# Patient Record
Sex: Female | Born: 1945
Health system: Southern US, Community
[De-identification: ages and names within clinical notes are randomized; demographics above are authoritative.]

## PROBLEM LIST (undated history)

## (undated) DIAGNOSIS — I10 Essential (primary) hypertension: Secondary | ICD-10-CM

## (undated) DIAGNOSIS — T7840XA Allergy, unspecified, initial encounter: Secondary | ICD-10-CM

## (undated) DIAGNOSIS — G43909 Migraine, unspecified, not intractable, without status migrainosus: Secondary | ICD-10-CM

## (undated) DIAGNOSIS — K635 Polyp of colon: Secondary | ICD-10-CM

## (undated) DIAGNOSIS — F419 Anxiety disorder, unspecified: Secondary | ICD-10-CM

## (undated) DIAGNOSIS — E785 Hyperlipidemia, unspecified: Secondary | ICD-10-CM

## (undated) DIAGNOSIS — M1990 Unspecified osteoarthritis, unspecified site: Secondary | ICD-10-CM

## (undated) DIAGNOSIS — E039 Hypothyroidism, unspecified: Secondary | ICD-10-CM

## (undated) DIAGNOSIS — H269 Unspecified cataract: Secondary | ICD-10-CM

## (undated) DIAGNOSIS — H43399 Other vitreous opacities, unspecified eye: Secondary | ICD-10-CM

## (undated) DIAGNOSIS — E119 Type 2 diabetes mellitus without complications: Secondary | ICD-10-CM

## (undated) DIAGNOSIS — K8020 Calculus of gallbladder without cholecystitis without obstruction: Secondary | ICD-10-CM

## (undated) HISTORY — DX: Unspecified osteoarthritis, unspecified site: M19.90

## (undated) HISTORY — DX: Calculus of gallbladder without cholecystitis without obstruction: K80.20

## (undated) HISTORY — DX: Migraine, unspecified, not intractable, without status migrainosus: G43.909

## (undated) HISTORY — PX: SIGMOIDOSCOPY: SUR1295

## (undated) HISTORY — DX: Type 2 diabetes mellitus without complications: E11.9

## (undated) HISTORY — DX: Hyperlipidemia, unspecified: E78.5

## (undated) HISTORY — PX: TONSILLECTOMY: SUR1361

## (undated) HISTORY — DX: Essential (primary) hypertension: I10

## (undated) HISTORY — DX: Other vitreous opacities, unspecified eye: H43.399

## (undated) HISTORY — DX: Hypothyroidism, unspecified: E03.9

## (undated) HISTORY — DX: Unspecified cataract: H26.9

## (undated) HISTORY — PX: TUBAL LIGATION: SHX77

## (undated) HISTORY — DX: Anxiety disorder, unspecified: F41.9

## (undated) HISTORY — DX: Allergy, unspecified, initial encounter: T78.40XA

## (undated) HISTORY — DX: Polyp of colon: K63.5

## (undated) HISTORY — PX: OTHER SURGICAL HISTORY: SHX169

---

## 1998-01-11 ENCOUNTER — Ambulatory Visit (HOSPITAL_COMMUNITY): Admission: RE | Admit: 1998-01-11 | Discharge: 1998-01-11 | Payer: Self-pay | Admitting: *Deleted

## 1999-04-18 ENCOUNTER — Encounter: Admission: RE | Admit: 1999-04-18 | Discharge: 1999-04-18 | Payer: Self-pay | Admitting: Family Medicine

## 1999-04-18 ENCOUNTER — Encounter: Payer: Self-pay | Admitting: Family Medicine

## 1999-04-22 ENCOUNTER — Other Ambulatory Visit: Admission: RE | Admit: 1999-04-22 | Discharge: 1999-04-22 | Payer: Self-pay | Admitting: Family Medicine

## 1999-08-01 ENCOUNTER — Encounter: Payer: Self-pay | Admitting: Family Medicine

## 1999-08-01 ENCOUNTER — Encounter: Admission: RE | Admit: 1999-08-01 | Discharge: 1999-08-01 | Payer: Self-pay | Admitting: Family Medicine

## 2000-04-18 ENCOUNTER — Encounter: Payer: Self-pay | Admitting: Family Medicine

## 2000-04-18 ENCOUNTER — Encounter: Admission: RE | Admit: 2000-04-18 | Discharge: 2000-04-18 | Payer: Self-pay | Admitting: Family Medicine

## 2000-04-27 ENCOUNTER — Other Ambulatory Visit: Admission: RE | Admit: 2000-04-27 | Discharge: 2000-04-27 | Payer: Self-pay | Admitting: Family Medicine

## 2000-05-23 ENCOUNTER — Encounter: Payer: Self-pay | Admitting: Occupational Medicine

## 2000-05-23 ENCOUNTER — Encounter: Admission: RE | Admit: 2000-05-23 | Discharge: 2000-05-23 | Payer: Self-pay | Admitting: Occupational Medicine

## 2001-04-22 ENCOUNTER — Encounter: Admission: RE | Admit: 2001-04-22 | Discharge: 2001-04-22 | Payer: Self-pay | Admitting: Family Medicine

## 2001-04-22 ENCOUNTER — Encounter: Payer: Self-pay | Admitting: Family Medicine

## 2001-05-17 ENCOUNTER — Other Ambulatory Visit: Admission: RE | Admit: 2001-05-17 | Discharge: 2001-05-17 | Payer: Self-pay | Admitting: Family Medicine

## 2002-04-23 ENCOUNTER — Encounter: Payer: Self-pay | Admitting: Family Medicine

## 2002-04-23 ENCOUNTER — Encounter: Admission: RE | Admit: 2002-04-23 | Discharge: 2002-04-23 | Payer: Self-pay | Admitting: Family Medicine

## 2002-05-19 ENCOUNTER — Other Ambulatory Visit: Admission: RE | Admit: 2002-05-19 | Discharge: 2002-05-19 | Payer: Self-pay | Admitting: Family Medicine

## 2003-05-06 ENCOUNTER — Encounter: Admission: RE | Admit: 2003-05-06 | Discharge: 2003-05-06 | Payer: Self-pay | Admitting: Family Medicine

## 2003-05-19 ENCOUNTER — Other Ambulatory Visit: Admission: RE | Admit: 2003-05-19 | Discharge: 2003-05-19 | Payer: Self-pay | Admitting: Family Medicine

## 2003-06-02 ENCOUNTER — Encounter: Admission: RE | Admit: 2003-06-02 | Discharge: 2003-06-02 | Payer: Self-pay | Admitting: Family Medicine

## 2004-05-19 ENCOUNTER — Ambulatory Visit: Payer: Self-pay | Admitting: Family Medicine

## 2004-05-19 ENCOUNTER — Other Ambulatory Visit: Admission: RE | Admit: 2004-05-19 | Discharge: 2004-05-19 | Payer: Self-pay | Admitting: Family Medicine

## 2004-06-08 ENCOUNTER — Encounter: Admission: RE | Admit: 2004-06-08 | Discharge: 2004-06-08 | Payer: Self-pay | Admitting: Family Medicine

## 2004-08-26 ENCOUNTER — Ambulatory Visit: Payer: Self-pay | Admitting: Family Medicine

## 2004-10-10 ENCOUNTER — Ambulatory Visit: Payer: Self-pay | Admitting: Family Medicine

## 2005-05-23 ENCOUNTER — Ambulatory Visit: Payer: Self-pay | Admitting: Family Medicine

## 2005-05-25 ENCOUNTER — Ambulatory Visit: Payer: Self-pay | Admitting: Family Medicine

## 2005-06-05 ENCOUNTER — Other Ambulatory Visit: Admission: RE | Admit: 2005-06-05 | Discharge: 2005-06-05 | Payer: Self-pay | Admitting: Family Medicine

## 2005-06-05 ENCOUNTER — Ambulatory Visit: Payer: Self-pay | Admitting: Family Medicine

## 2005-06-06 ENCOUNTER — Encounter: Payer: Self-pay | Admitting: Family Medicine

## 2005-09-05 ENCOUNTER — Ambulatory Visit: Payer: Self-pay | Admitting: Family Medicine

## 2005-09-13 ENCOUNTER — Ambulatory Visit: Payer: Self-pay | Admitting: Family Medicine

## 2006-04-26 ENCOUNTER — Encounter: Admission: RE | Admit: 2006-04-26 | Discharge: 2006-04-26 | Payer: Self-pay | Admitting: Family Medicine

## 2006-05-23 ENCOUNTER — Ambulatory Visit: Payer: Self-pay | Admitting: Family Medicine

## 2006-05-23 LAB — CONVERTED CEMR LAB
AST: 17 units/L (ref 0–37)
Albumin: 3.7 g/dL (ref 3.5–5.2)
BUN: 11 mg/dL (ref 6–23)
Basophils Relative: 0.8 % (ref 0.0–1.0)
Chol/HDL Ratio, serum: 4.4
Cholesterol: 197 mg/dL (ref 0–200)
Creatinine, Ser: 0.9 mg/dL (ref 0.4–1.2)
Eosinophil percent: 4.8 % (ref 0.0–5.0)
HCT: 40.2 % (ref 36.0–46.0)
HDL: 44.7 mg/dL (ref >39.0)
Hemoglobin: 12.9 g/dL (ref 12.0–15.0)
MCHC: 32.2 g/dL (ref 30.0–36.0)
Neutrophils Relative %: 37.7 % — ABNORMAL LOW (ref 43.0–77.0)
Potassium: 4.1 meq/L (ref 3.5–5.1)
RDW: 13.8 % (ref 11.5–14.6)
Total Bilirubin: 0.7 mg/dL (ref 0.3–1.2)
Total Protein: 7.7 g/dL (ref 6.0–8.3)
WBC: 6.7 10*3/uL (ref 4.5–10.5)

## 2006-06-06 ENCOUNTER — Other Ambulatory Visit: Admission: RE | Admit: 2006-06-06 | Discharge: 2006-06-06 | Payer: Self-pay | Admitting: Family Medicine

## 2006-06-06 ENCOUNTER — Ambulatory Visit: Payer: Self-pay | Admitting: Family Medicine

## 2006-06-06 ENCOUNTER — Encounter: Payer: Self-pay | Admitting: Family Medicine

## 2006-07-06 ENCOUNTER — Ambulatory Visit: Payer: Self-pay | Admitting: Gastroenterology

## 2006-09-10 ENCOUNTER — Ambulatory Visit: Payer: Self-pay | Admitting: Family Medicine

## 2006-10-22 ENCOUNTER — Ambulatory Visit: Payer: Self-pay | Admitting: Family Medicine

## 2006-10-23 LAB — CONVERTED CEMR LAB
AST: 19 units/L (ref 0–37)
Amylase: 79 units/L (ref 27–131)
Bilirubin, Direct: 0.1 mg/dL (ref 0.0–0.3)
CO2: 33 meq/L — ABNORMAL HIGH (ref 19–32)
Chloride: 104 meq/L (ref 96–112)
Eosinophils Absolute: 0.3 10*3/uL (ref 0.0–0.6)
Eosinophils Relative: 3.5 % (ref 0.0–5.0)
GFR calc Af Amer: 111 mL/min
GFR calc non Af Amer: 91 mL/min
Glucose, Bld: 81 mg/dL (ref 70–99)
HCT: 38.9 % (ref 36.0–46.0)
Hemoglobin: 13.3 g/dL (ref 12.0–15.0)
Lymphocytes Relative: 42.4 % (ref 12.0–46.0)
MCV: 83.7 fL (ref 78.0–100.0)
Neutro Abs: 3.3 10*3/uL (ref 1.4–7.7)
Neutrophils Relative %: 44.1 % (ref 43.0–77.0)
Sodium: 140 meq/L (ref 135–145)
Total Protein: 8 g/dL (ref 6.0–8.3)
WBC: 7.7 10*3/uL (ref 4.5–10.5)

## 2006-10-28 ENCOUNTER — Encounter: Admission: RE | Admit: 2006-10-28 | Discharge: 2006-10-28 | Payer: Self-pay | Admitting: Family Medicine

## 2007-02-26 DIAGNOSIS — F411 Generalized anxiety disorder: Secondary | ICD-10-CM | POA: Insufficient documentation

## 2007-02-26 DIAGNOSIS — E782 Mixed hyperlipidemia: Secondary | ICD-10-CM | POA: Insufficient documentation

## 2007-02-26 DIAGNOSIS — I10 Essential (primary) hypertension: Secondary | ICD-10-CM | POA: Insufficient documentation

## 2007-02-26 DIAGNOSIS — E039 Hypothyroidism, unspecified: Secondary | ICD-10-CM | POA: Insufficient documentation

## 2007-05-17 ENCOUNTER — Encounter: Payer: Self-pay | Admitting: Family Medicine

## 2007-05-17 ENCOUNTER — Encounter: Admission: RE | Admit: 2007-05-17 | Discharge: 2007-05-17 | Payer: Self-pay | Admitting: Family Medicine

## 2007-06-12 ENCOUNTER — Ambulatory Visit: Payer: Self-pay | Admitting: Family Medicine

## 2007-06-12 LAB — CONVERTED CEMR LAB
Glucose, Urine, Semiquant: NEGATIVE
Nitrite: NEGATIVE
Protein, U semiquant: NEGATIVE
Specific Gravity, Urine: 1.015

## 2007-06-14 LAB — CONVERTED CEMR LAB
ALT: 19 units/L (ref 0–35)
AST: 25 units/L (ref 0–37)
Basophils Relative: 0.2 % (ref 0.0–1.0)
Bilirubin, Direct: 0.1 mg/dL (ref 0.0–0.3)
CO2: 30 meq/L (ref 19–32)
Calcium: 9.3 mg/dL (ref 8.4–10.5)
Chloride: 103 meq/L (ref 96–112)
Creatinine, Ser: 0.9 mg/dL (ref 0.4–1.2)
Eosinophils Relative: 4.1 % (ref 0.0–5.0)
GFR calc non Af Amer: 68 mL/min
Glucose, Bld: 107 mg/dL — ABNORMAL HIGH (ref 70–99)
HCT: 37.8 % (ref 36.0–46.0)
LDL Cholesterol: 81 mg/dL (ref 0–99)
MCV: 82.5 fL (ref 78.0–100.0)
Neutrophils Relative %: 49.4 % (ref 43.0–77.0)
Platelets: 257 10*3/uL (ref 150–400)
RBC: 4.58 M/uL (ref 3.87–5.11)
RDW: 12.8 % (ref 11.5–14.6)
Sodium: 141 meq/L (ref 135–145)
TSH: 2.74 microintl units/mL (ref 0.35–5.50)
Total Bilirubin: 0.7 mg/dL (ref 0.3–1.2)
Total CHOL/HDL Ratio: 3.4
Triglycerides: 123 mg/dL (ref 0–149)
VLDL: 25 mg/dL (ref 0–40)
WBC: 6.8 10*3/uL (ref 4.5–10.5)

## 2007-06-19 ENCOUNTER — Ambulatory Visit: Payer: Self-pay | Admitting: Family Medicine

## 2007-06-19 ENCOUNTER — Encounter: Payer: Self-pay | Admitting: Family Medicine

## 2007-06-19 ENCOUNTER — Other Ambulatory Visit: Admission: RE | Admit: 2007-06-19 | Discharge: 2007-06-19 | Payer: Self-pay | Admitting: Family Medicine

## 2007-06-25 ENCOUNTER — Encounter: Payer: Self-pay | Admitting: Family Medicine

## 2008-05-18 ENCOUNTER — Encounter: Admission: RE | Admit: 2008-05-18 | Discharge: 2008-05-18 | Payer: Self-pay | Admitting: Family Medicine

## 2008-05-18 ENCOUNTER — Ambulatory Visit: Payer: Self-pay | Admitting: Family Medicine

## 2008-06-16 ENCOUNTER — Ambulatory Visit: Payer: Self-pay | Admitting: Family Medicine

## 2008-06-16 LAB — CONVERTED CEMR LAB
Ketones, urine, test strip: NEGATIVE
Nitrite: NEGATIVE
Specific Gravity, Urine: 1.02
Urobilinogen, UA: 0.2
WBC Urine, dipstick: NEGATIVE

## 2008-06-19 LAB — CONVERTED CEMR LAB
ALT: 19 units/L (ref 0–35)
AST: 24 units/L (ref 0–37)
Alkaline Phosphatase: 66 units/L (ref 39–117)
BUN: 10 mg/dL (ref 6–23)
Bilirubin, Direct: 0.1 mg/dL (ref 0.0–0.3)
CO2: 30 meq/L (ref 19–32)
Chloride: 103 meq/L (ref 96–112)
Eosinophils Relative: 4.5 % (ref 0.0–5.0)
Glucose, Bld: 113 mg/dL — ABNORMAL HIGH (ref 70–99)
HDL: 34.7 mg/dL — ABNORMAL LOW (ref >39.0)
LDL Cholesterol: 76 mg/dL (ref 0–99)
Lymphocytes Relative: 37.2 % (ref 12.0–46.0)
Monocytes Relative: 7.4 % (ref 3.0–12.0)
Platelets: 228 10*3/uL (ref 150–400)
Potassium: 3.9 meq/L (ref 3.5–5.1)
RDW: 13.3 % (ref 11.5–14.6)
Sodium: 142 meq/L (ref 135–145)
Total CHOL/HDL Ratio: 4.1
Total Protein: 8.1 g/dL (ref 6.0–8.3)
Triglycerides: 169 mg/dL — ABNORMAL HIGH (ref 0–149)
WBC: 7.4 10*3/uL (ref 4.5–10.5)

## 2008-06-22 ENCOUNTER — Ambulatory Visit: Payer: Self-pay | Admitting: Family Medicine

## 2008-08-13 ENCOUNTER — Telehealth: Payer: Self-pay | Admitting: Family Medicine

## 2009-05-20 ENCOUNTER — Encounter: Admission: RE | Admit: 2009-05-20 | Discharge: 2009-05-20 | Payer: Self-pay | Admitting: Family Medicine

## 2009-06-22 ENCOUNTER — Ambulatory Visit: Payer: Self-pay | Admitting: Family Medicine

## 2009-06-22 LAB — CONVERTED CEMR LAB
Bilirubin Urine: NEGATIVE
Nitrite: NEGATIVE
Protein, U semiquant: NEGATIVE
Urobilinogen, UA: 0.2

## 2009-06-24 LAB — CONVERTED CEMR LAB
ALT: 17 units/L (ref 0–35)
AST: 21 units/L (ref 0–37)
BUN: 9 mg/dL (ref 6–23)
Bilirubin, Direct: 0.1 mg/dL (ref 0.0–0.3)
Cholesterol: 141 mg/dL (ref 0–200)
Eosinophils Relative: 3.4 % (ref 0.0–5.0)
GFR calc non Af Amer: 77.42 mL/min (ref >60)
HCT: 39.6 % (ref 36.0–46.0)
LDL Cholesterol: 67 mg/dL (ref 0–99)
Monocytes Relative: 7.3 % (ref 3.0–12.0)
Neutrophils Relative %: 56.1 % (ref 43.0–77.0)
Platelets: 216 10*3/uL (ref 150.0–400.0)
Potassium: 4 meq/L (ref 3.5–5.1)
Total Bilirubin: 0.6 mg/dL (ref 0.3–1.2)
Total CHOL/HDL Ratio: 3
VLDL: 28.2 mg/dL (ref 0.0–40.0)
WBC: 7 10*3/uL (ref 4.5–10.5)

## 2009-07-06 ENCOUNTER — Ambulatory Visit: Payer: Self-pay | Admitting: Family Medicine

## 2009-07-06 ENCOUNTER — Other Ambulatory Visit: Admission: RE | Admit: 2009-07-06 | Discharge: 2009-07-06 | Payer: Self-pay | Admitting: Family Medicine

## 2009-07-09 ENCOUNTER — Encounter: Payer: Self-pay | Admitting: Family Medicine

## 2009-09-23 ENCOUNTER — Encounter (INDEPENDENT_AMBULATORY_CARE_PROVIDER_SITE_OTHER): Payer: Self-pay | Admitting: *Deleted

## 2009-10-13 ENCOUNTER — Encounter (INDEPENDENT_AMBULATORY_CARE_PROVIDER_SITE_OTHER): Payer: Self-pay

## 2009-10-19 ENCOUNTER — Ambulatory Visit: Payer: Self-pay | Admitting: Gastroenterology

## 2009-10-29 ENCOUNTER — Ambulatory Visit: Payer: Self-pay | Admitting: Gastroenterology

## 2009-10-29 LAB — HM COLONOSCOPY

## 2009-11-02 ENCOUNTER — Encounter: Payer: Self-pay | Admitting: Gastroenterology

## 2010-04-01 ENCOUNTER — Encounter: Payer: Self-pay | Admitting: Family Medicine

## 2010-06-01 ENCOUNTER — Encounter
Admission: RE | Admit: 2010-06-01 | Discharge: 2010-06-01 | Payer: Self-pay | Source: Home / Self Care | Attending: Family Medicine | Admitting: Family Medicine

## 2010-06-18 ENCOUNTER — Encounter: Payer: Self-pay | Admitting: Family Medicine

## 2010-06-21 NOTE — Assessment & Plan Note (Signed)
Summary: cpx/cjr   Vital Signs:  Patient profile:   65 year old female Height:      64 inches Weight:      154 pounds BMI:     26.53 Temp:     98.3 degrees F oral Pulse rate:   79 / minute BP sitting:   134 / 74  (left arm) Cuff size:   regular  Vitals Entered By: Alfred Levins, CMA (July 06, 2009 2:42 PM) CC: cpx with pap   History of Present Illness: 66 yr old female for cpx. She feels fine and has no concerns.   Current Medications (verified): 1)  Norvasc 5 Mg  Tabs (Amlodipine Besylate) .Marland Kitchen.. 1 By Mouth Once Daily 2)  Synthroid 75 Mcg  Tabs (Levothyroxine Sodium) .Marland Kitchen.. 1 By Mouth Once Daily 3)  Valium 2 Mg  Tabs (Diazepam) .... Two Times A Day As Needed Anxiety 4)  Crestor 10 Mg Tabs (Rosuvastatin Calcium) .... Take 1 Tab By Mouth Daily  Allergies (verified): 1)  ! Sulfa 2)  ! Pcn 3)  ! Codeine  Past History:  Past Medical History: Reviewed history from 06/19/2007 and no changes required. Allergies Sinusitis DJD Anxiety Hyperlipidemia Hypertension Hypothyroidism migraines retinal floaters, sees Dr. Cecilie Kicks  Past Surgical History: Colonoscopy 09-30-99 per Dr. Arlyce Dice, repeat in 10 yrs CB x2 Tonsillectomy  Family History: Reviewed history from 02/26/2007 and no changes required. Family History of Alcoholism/Addiction Family History Diabetes 1st degree relative Family History High cholesterol Family History Hypertension Family History of Stroke F 1st degree relative <60 Family History Weight disorder Family History of Respiratory disease  Social History: Reviewed history from 02/26/2007 and no changes required. Occupation: Married Never Smoked Alcohol use-no  Review of Systems  The patient denies anorexia, fever, weight loss, weight gain, vision loss, decreased hearing, hoarseness, chest pain, syncope, dyspnea on exertion, peripheral edema, prolonged cough, headaches, hemoptysis, abdominal pain, melena, hematochezia, severe indigestion/heartburn,  hematuria, incontinence, genital sores, muscle weakness, suspicious skin lesions, transient blindness, difficulty walking, depression, unusual weight change, abnormal bleeding, enlarged lymph nodes, angioedema, breast masses, and testicular masses.    Physical Exam  General:  Well-developed,well-nourished,in no acute distress; alert,appropriate and cooperative throughout examination Head:  Normocephalic and atraumatic without obvious abnormalities. No apparent alopecia or balding. Eyes:  No corneal or conjunctival inflammation noted. EOMI. Perrla. Funduscopic exam benign, without hemorrhages, exudates or papilledema. Vision grossly normal. Ears:  External ear exam shows no significant lesions or deformities.  Otoscopic examination reveals clear canals, tympanic membranes are intact bilaterally without bulging, retraction, inflammation or discharge. Hearing is grossly normal bilaterally. Nose:  External nasal examination shows no deformity or inflammation. Nasal mucosa are pink and moist without lesions or exudates. Mouth:  Oral mucosa and oropharynx without lesions or exudates.  Teeth in good repair. Neck:  No deformities, masses, or tenderness noted. No bruits Chest Wall:  No deformities, masses, or tenderness noted. Breasts:  No mass, nodules, thickening, tenderness, bulging, retraction, inflamation, nipple discharge or skin changes noted.   Lungs:  Normal respiratory effort, chest expands symmetrically. Lungs are clear to auscultation, no crackles or wheezes. Heart:  Normal rate and regular rhythm. S1 and S2 normal without gallop, murmur, click, rub or other extra sounds. EKG normal  Abdomen:  Bowel sounds positive,abdomen soft and non-tender without masses, organomegaly or hernias noted. Rectal:  No external abnormalities noted. Normal sphincter tone. No rectal masses or tenderness. Heme neg. Genitalia:  Pelvic Exam:        External: normal female genitalia without lesions  or masses         Vagina: normal without lesions or masses        Cervix: normal without lesions or masses        Adnexa: normal bimanual exam without masses or fullness        Uterus: normal by palpation        Pap smear: performed Msk:  No deformity or scoliosis noted of thoracic or lumbar spine.   Pulses:  R and L carotid,radial,femoral,dorsalis pedis and posterior tibial pulses are full and equal bilaterally Extremities:  No clubbing, cyanosis, edema, or deformity noted with normal full range of motion of all joints.   Neurologic:  No cranial nerve deficits noted. Station and gait are normal. Plantar reflexes are down-going bilaterally. DTRs are symmetrical throughout. Sensory, motor and coordinative functions appear intact. Skin:  Intact without suspicious lesions or rashes Cervical Nodes:  No lymphadenopathy noted Axillary Nodes:  No palpable lymphadenopathy Inguinal Nodes:  No significant adenopathy Psych:  Cognition and judgment appear intact. Alert and cooperative with normal attention span and concentration. No apparent delusions, illusions, hallucinations   Impression & Recommendations:  Problem # 1:  WELL ADULT EXAM (ICD-V70.0)  Orders: Hemoccult Guaiac-1 spec.(in office) (82270) EKG w/ Interpretation (93000)  Complete Medication List: 1)  Norvasc 5 Mg Tabs (Amlodipine besylate) .Marland Kitchen.. 1 by mouth once daily 2)  Synthroid 75 Mcg Tabs (Levothyroxine sodium) .Marland Kitchen.. 1 by mouth once daily 3)  Valium 2 Mg Tabs (Diazepam) .... Two times a day as needed anxiety 4)  Crestor 10 Mg Tabs (Rosuvastatin calcium) .... Take 1 tab by mouth daily  Patient Instructions: 1)  She will contact Dr. Marzetta Board office about setting up another colonoscopy this spring.  2)  Please schedule a follow-up appointment in 1 year.  Prescriptions: CRESTOR 10 MG TABS (ROSUVASTATIN CALCIUM) Take 1 tab by mouth daily  #90 x 3   Entered and Authorized by:   Nelwyn Salisbury MD   Signed by:   Nelwyn Salisbury MD on 07/06/2009   Method  used:   Print then Give to Patient   RxID:   [id number] VALIUM 2 MG  TABS (DIAZEPAM) two times a day as needed anxiety  #180 x 1   Entered and Authorized by:   Nelwyn Salisbury MD   Signed by:   Nelwyn Salisbury MD on 07/06/2009   Method used:   Print then Give to Patient   RxID:   [id number] SYNTHROID 75 MCG  TABS (LEVOTHYROXINE SODIUM) 1 by mouth once daily Brand medically necessary #90 x 3   Entered and Authorized by:   Nelwyn Salisbury MD   Signed by:   Nelwyn Salisbury MD on 07/06/2009   Method used:   Print then Give to Patient   RxID:   [id number] NORVASC 5 MG  TABS (AMLODIPINE BESYLATE) 1 by mouth once daily  #90 x 3   Entered and Authorized by:   Nelwyn Salisbury MD   Signed by:   Nelwyn Salisbury MD on 07/06/2009   Method used:   Print then Give to Patient   RxID:   [id number]   Prevention & Chronic Care Immunizations   Influenza vaccine: Not documented    Tetanus booster: 05/22/1993: Historical    Pneumococcal vaccine: Not documented    H. zoster vaccine: 05/18/2008: Zostavax  Colorectal Screening   Hemoccult: Not documented    Colonoscopy: Not documented  Other Screening   Pap smear: Not documented  Mammogram: normal  (05/20/2009)   Mammogram due: 05/2010    DXA bone density scan: Not documented   Smoking status: never  (02/26/2007)  Lipids   Total Cholesterol: 141  (06/22/2009)   LDL: 67  (06/22/2009)   LDL Direct: Not documented   HDL: 45.70  (06/22/2009)   Triglycerides: 141.0  (06/22/2009)    SGOT (AST): 21  (06/22/2009)   SGPT (ALT): 17  (06/22/2009)   Alkaline phosphatase: 67  (06/22/2009)   Total bilirubin: 0.6  (06/22/2009)  Hypertension   Last Blood Pressure: 134 / 74  (07/06/2009)   Serum creatinine: 0.8  (06/22/2009)   Serum potassium 4.0  (06/22/2009)  Self-Management Support :    Hypertension self-management support: Not documented    Lipid self-management support: Not documented    Preventive Care  Screening  Mammogram:    Date:  05/20/2009    Next Due:  05/2010    Results:  normal

## 2010-06-21 NOTE — Miscellaneous (Signed)
Summary: Lec previsit  Clinical Lists Changes  Medications: Added new medication of MOVIPREP 100 GM  SOLR (PEG-KCL-NACL-NASULF-NA ASC-C) As per prep instructions. - Signed Rx of MOVIPREP 100 GM  SOLR (PEG-KCL-NACL-NASULF-NA ASC-C) As per prep instructions.;  #1 x 0;  Signed;  Entered by: [name];  Authorized by: Louis Meckel MD;  Method used: [facility] to [facility] [address] [phone]*, [address], [place], [place], [place]  [address], Ph: [phone] or [phone], Fax: [phone] Observations: Added new observation of ALLERGY REV: Done (10/19/2009 15:55)    Prescriptions: MOVIPREP 100 GM  SOLR (PEG-KCL-NACL-NASULF-NA ASC-C) As per prep instructions.  #1 x 0   Entered by:   [name]   Authorized by:   Louis Meckel MD   Signed by:   [name] on 10/19/2009   Method used:   [facility] to        [facility] [address] [phone]* (retail)       [address]       [place], [place]  [address]       Ph: [phone] or [phone]       Fax: [phone]   RxID:   [id number]

## 2010-06-21 NOTE — Letter (Signed)
Summary: [facility]   Imported By: Maryln Gottron 04/20/2010 14:39:52  _____________________________________________________________________  External Attachment:    Type:   Image     Comment:   External Document

## 2010-06-21 NOTE — Letter (Signed)
Summary: Results Follow-up Letter  [facility]  [address] [place], [place] [address]   Phone: [phone]  Fax: [phone]    07/09/2009  [address] [place], [place]  [address]  Dear Ms. Salsgiver,   The following are the results of your recent test(s):  Test     Result     Pap Smear    Normal___X____  Not Normal_____       Comments: _________________________________________________________ Cholesterol LDL(Bad cholesterol):          Your goal is less than:         HDL (Good cholesterol):        Your goal is more than: _________________________________________________________ Other Tests:   _________________________________________________________  Please call for an appointment Or __Repeat in 2 years______________________________________ _________________________________________________________ _________________________________________________________  Sincerely,  Gershon Crane, MD [place] [facility]

## 2010-06-21 NOTE — Letter (Signed)
Summary: Previsit letter  [facility]  [address] [place], [place] [address]   Phone: [phone]  Fax: [phone]       09/23/2009 MRN: [id number]  [facility] [place] [address] [place], [place]  [address]  Dear Ms. Herford,  Welcome to the Gastroenterology Division at [facility].    You are scheduled to see a nurse for your pre-procedure visit on 10-19-09 at 4:30pm on the 3rd floor at [facility], [address]  We ask that you try to arrive at our office 15 minutes prior to your appointment time to allow for check-in.  Your nurse visit will consist of discussing your medical and surgical history, your immediate family medical history, and your medications.    Please bring a complete list of all your medications or, if you prefer, bring the medication bottles and we will list them.  We will need to be aware of both prescribed and over the counter drugs.  We will need to know exact dosage information as well.  If you are on blood thinners (Coumadin, Plavix, Aggrenox, Ticlid, etc.) please call our office today/prior to your appointment, as we need to consult with your physician about holding your medication.   Please be prepared to read and sign documents such as consent forms, a financial agreement, and acknowledgement forms.  If necessary, and with your consent, a friend or relative is welcome to sit-in on the nurse visit with you.  Please bring your insurance card so that we may make a copy of it.  If your insurance requires a referral to see a specialist, please bring your referral form from your primary care physician.  No co-pay is required for this nurse visit.     If you cannot keep your appointment, please call [phone] to cancel or reschedule prior to your appointment date.  This allows [place] the opportunity to schedule an appointment for another patient in need of care.    Thank you for choosing [facility] for your medical needs.   We appreciate the opportunity to care for you.  Please visit [place] at our website  to learn more about our practice.                     Sincerely.                                                                                                                   The Gastroenterology Division

## 2010-06-21 NOTE — Letter (Signed)
Summary: Patient Notice- Polyp Results  [facility]  [address] [place], [place] [address]   Phone: [phone]  Fax: [phone]        November 02, 2009 MRN: [id number]    [facility] [address] [place], [place]  [address]    Dear Gabriella Farley,  I am pleased to inform you that the colon polyp(s) removed during your recent colonoscopy was (were) found to be benign (no cancer detected) upon pathologic examination.  I recommend you have a repeat colonoscopy examination in 3_ years to look for recurrent polyps, as having colon polyps increases your risk for having recurrent polyps or even colon cancer in the future.  Should you develop new or worsening symptoms of abdominal pain, bowel habit changes or bleeding from the rectum or bowels, please schedule an evaluation with either your primary care physician or with me.  Additional information/recommendations:  __ No further action with gastroenterology is needed at this time. Please      follow-up with your primary care physician for your other healthcare      needs.  __ Please call [phone] to schedule a return visit to review your      situation.  __ Please keep your follow-up visit as already scheduled.  _x_ Continue treatment plan as outlined the day of your exam.  Please call us if you are having persistent problems or have questions about your condition that have not been fully answered at this time.  Sincerely,  Louis Meckel MD  This letter has been electronically signed by your physician.  Appended Document: Patient Notice- Polyp Results letter mailed.

## 2010-06-21 NOTE — Procedures (Signed)
Summary: Colonoscopy  Patient: Gabriella Farley Note: All result statuses are Final unless otherwise noted.  Tests: (1) Colonoscopy (COL)   COL Colonoscopy           DONE     [facility]     [address]     [place], [place]  [address]           COLONOSCOPY PROCEDURE REPORT           PATIENT:  Gabriella, Farley  MR#:  [id number]     BIRTHDATE:  1946/01/18, 63 yrs. old  GENDER:  female           ENDOSCOPIST:  Barbette Hair. Arlyce Dice, MD     Referred by:           PROCEDURE DATE:  10/29/2009     PROCEDURE:  Colonoscopy with snare polypectomy, Colon with cold     biopsy polypectomy     ASA CLASS:  Class II     INDICATIONS:  1) Routine Risk Screening           MEDICATIONS:   Fentanyl 75 mcg IV, Versed 7 mg IV, promethazine     (Phenergan) 12.5 mg IV           DESCRIPTION OF PROCEDURE:   After the risks benefits and     alternatives of the procedure were thoroughly explained, informed     consent was obtained.  No rectal exam performed. The LB CF-[id number]     [id number] endoscope was introduced through the anus and advanced to     the cecum, which was identified by both the appendix and ileocecal     valve, without limitations.  The quality of the prep was     excellent, using MoviPrep.  The instrument was then slowly     withdrawn as the colon was fully examined.     <<PROCEDUREIMAGES>>           FINDINGS:  There were multiple polyps identified and removed (see     image3 and image5). Cluster of 3 2-70mm polyps - removed with cold     snare and cold bx forceps - submitted to pathology  A sessile     polyp was found in the ascending colon. It was 1 cm in size. Polyp     was snared, then cauterized with monopolar cautery. Retrieval was     unsuccessful (see image6). snare polyp  This was otherwise a     normal examination of the colon (see image2, image7, image8, and     image9).   Retroflexed views in the rectum revealed no     abnormalities.    The time to cecum =  5.0  minutes. The  scope was     then withdrawn (time =  9.75  min) from the patient and the     procedure completed.           COMPLICATIONS:  None           ENDOSCOPIC IMPRESSION:     1) Polyps, multiple     2) 1 cm sessile polyp in the ascending colon     3) Otherwise normal examination     RECOMMENDATIONS:     1) Colonoscopy in 3 years because of multiplicity of polyps     (largest polyp was not retrieved)           REPEAT EXAM:  In 3 year(s) for Colonoscopy.  ______________________________     Barbette Hair Arlyce Dice, MD           CC: Nelwyn Salisbury, MD           n.     Rosalie DoctorBarbette Hair. Kaplan at 10/29/2009 08:52 AM           Zola Button, [id number]  Note: An exclamation mark (!) indicates a result that was not dispersed into the flowsheet. Document Creation Date: 10/29/2009 8:52 AM _______________________________________________________________________  (1) Order result status: Final Collection or observation date-time: 10/29/2009 08:40 Requested date-time:  Receipt date-time:  Reported date-time:  Referring Physician:   Ordering Physician: Melvia Heaps [phone]) Specimen Source:  Source: Launa Grill Order Number: [id number] Lab site:   Appended Document: Colonoscopy     Procedures Next Due Date:    Colonoscopy: 10/2012

## 2010-06-21 NOTE — Letter (Signed)
Summary: [facility] Instructions  [facility]  [address] [place], [place] [address]   Phone: [phone]  Fax: [phone]       Gabriella Farley    18-Aug-1948    MRN: [id number]        Procedure Day Dorna Bloom:  Gabriella Farley  10/29/09     Arrival Time:  7:30AM     Procedure Time:  8:00AM     Location of Procedure:                    _ X_  [facility] (4th Floor)                        PREPARATION FOR COLONOSCOPY WITH MOVIPREP   Starting 5 days prior to your procedure 10/24/09 do not eat nuts, seeds, popcorn, corn, beans, peas,  salads, or any raw vegetables.  Do not take any fiber supplements (e.g. Metamucil, Citrucel, and Benefiber).  THE DAY BEFORE YOUR PROCEDURE         DATE: 10/28/09  DAY: THURSDAY  1.  Drink clear liquids the entire day-NO SOLID FOOD  2.  Do not drink anything colored red or purple.  Avoid juices with pulp.  No orange juice.  3.  Drink at least 64 oz. (8 glasses) of fluid/clear liquids during the day to prevent dehydration and help the prep work efficiently.  CLEAR LIQUIDS INCLUDE: Water Jello Ice Popsicles Tea (sugar ok, no milk/cream) Powdered fruit flavored drinks Coffee (sugar ok, no milk/cream) Gatorade Juice: apple, white grape, white cranberry  Lemonade Clear bullion, consomm, broth Carbonated beverages (any kind) Strained chicken noodle soup Hard Candy                             4.  In the morning, mix first dose of MoviPrep solution:    Empty 1 Pouch A and 1 Pouch B into the disposable container    Add lukewarm drinking water to the top line of the container. Mix to dissolve    Refrigerate (mixed solution should be used within 24 hrs)  5.  Begin drinking the prep at 5:00 p.m. The MoviPrep container is divided by 4 marks.   Every 15 minutes drink the solution down to the next mark (approximately 8 oz) until the full liter is complete.   6.  Follow completed prep with 16 oz of clear liquid of your choice (Nothing  red or purple).  Continue to drink clear liquids until bedtime.  7.  Before going to bed, mix second dose of MoviPrep solution:    Empty 1 Pouch A and 1 Pouch B into the disposable container    Add lukewarm drinking water to the top line of the container. Mix to dissolve    Refrigerate  THE DAY OF YOUR PROCEDURE      DATE: 10/29/09  DAY: FRIDAY  Beginning at 3:00AM (5 hours before procedure):         1. Every 15 minutes, drink the solution down to the next mark (approx 8 oz) until the full liter is complete.  2. Follow completed prep with 16 oz. of clear liquid of your choice.    3. You may drink clear liquids until 6:00AM (2 HOURS BEFORE PROCEDURE).   MEDICATION INSTRUCTIONS  Unless otherwise instructed, you should take regular prescription medications with a small sip of water   as early as possible the morning  of your procedure.         OTHER INSTRUCTIONS  You will need a responsible adult at least 63 years of age to accompany you and drive you home.   This person must remain in the waiting room during your procedure.  Wear loose fitting clothing that is easily removed.  Leave jewelry and other valuables at home.  However, you may wish to bring a book to read or  an iPod/MP3 player to listen to music as you wait for your procedure to start.  Remove all body piercing jewelry and leave at home.  Total time from sign-in until discharge is approximately 2-3 hours.  You should go home directly after your procedure and rest.  You can resume normal activities the  day after your procedure.  The day of your procedure you should not:   Drive   Make legal decisions   Operate machinery   Drink alcohol   Return to work  You will receive specific instructions about eating, activities and medications before you leave.    The above instructions have been reviewed and explained to me by   [name]  Oct 19, 2009 4:22 PM     I fully understand and can  verbalize these instructions _____________________________ Date _________

## 2010-06-28 ENCOUNTER — Other Ambulatory Visit: Payer: BC Managed Care – PPO | Admitting: Family Medicine

## 2010-06-28 DIAGNOSIS — Z0000 Encounter for general adult medical examination without abnormal findings: Secondary | ICD-10-CM

## 2010-06-28 DIAGNOSIS — E785 Hyperlipidemia, unspecified: Secondary | ICD-10-CM

## 2010-06-28 LAB — CBC WITH DIFFERENTIAL/PLATELET
Basophils Relative: 0.6 % (ref 0.0–3.0)
Eosinophils Absolute: 0.3 10*3/uL (ref 0.0–0.7)
Hemoglobin: 13.8 g/dL (ref 12.0–15.0)
Lymphocytes Relative: 35.8 % (ref 12.0–46.0)
MCHC: 33.6 g/dL (ref 30.0–36.0)
MCV: 83.5 fl (ref 78.0–100.0)
Monocytes Absolute: 0.6 10*3/uL (ref 0.1–1.0)
Neutro Abs: 4.8 10*3/uL (ref 1.4–7.7)
RBC: 4.91 Mil/uL (ref 3.87–5.11)

## 2010-06-28 LAB — BASIC METABOLIC PANEL
CO2: 30 mEq/L (ref 19–32)
Chloride: 102 mEq/L (ref 96–112)
Potassium: 4.4 mEq/L (ref 3.5–5.1)
Sodium: 141 mEq/L (ref 135–145)

## 2010-06-28 LAB — POCT URINALYSIS DIPSTICK
Ketones, UA: NEGATIVE
Leukocytes, UA: NEGATIVE
Nitrite, UA: NEGATIVE
Protein, UA: NEGATIVE

## 2010-06-28 LAB — LIPID PANEL: HDL: 40.8 mg/dL (ref >39.00)

## 2010-06-28 LAB — HEPATIC FUNCTION PANEL
Albumin: 4.1 g/dL (ref 3.5–5.2)
Bilirubin, Direct: 0.1 mg/dL (ref 0.0–0.3)
Total Protein: 7.9 g/dL (ref 6.0–8.3)

## 2010-06-28 LAB — LDL CHOLESTEROL, DIRECT: Direct LDL: 101 mg/dL

## 2010-06-30 ENCOUNTER — Telehealth: Payer: Self-pay

## 2010-06-30 NOTE — Telephone Encounter (Signed)
Message copied by Madison Hickman on Thu Jun 30, 2010  9:42 AM ------      Message from: Dwaine Deter      Created: Wed Jun 29, 2010  5:07 PM       Normal except high glucose. Lower the carbs in the diet

## 2010-06-30 NOTE — Telephone Encounter (Signed)
Pt aware.

## 2010-07-08 ENCOUNTER — Ambulatory Visit (INDEPENDENT_AMBULATORY_CARE_PROVIDER_SITE_OTHER): Payer: BC Managed Care – PPO | Admitting: Family Medicine

## 2010-07-08 ENCOUNTER — Encounter: Payer: Self-pay | Admitting: Family Medicine

## 2010-07-08 VITALS — BP 120/80 | HR 79 | Temp 98.8°F | Ht 65.0 in | Wt 157.0 lb

## 2010-07-08 DIAGNOSIS — Z0000 Encounter for general adult medical examination without abnormal findings: Secondary | ICD-10-CM

## 2010-07-08 MED ORDER — AMLODIPINE BESYLATE 5 MG PO TABS: 5.0000 mg | ORAL_TABLET | Freq: Every day | ORAL | Status: DC

## 2010-07-08 MED ORDER — DIAZEPAM 2 MG PO TABS: 2.0000 mg | ORAL_TABLET | Freq: Two times a day (BID) | ORAL | Status: DC

## 2010-07-08 MED ORDER — LEVOTHYROXINE SODIUM 75 MCG PO TABS: 75.0000 ug | ORAL_TABLET | Freq: Every day | ORAL | Status: DC

## 2010-07-08 MED ORDER — ROSUVASTATIN CALCIUM 10 MG PO TABS: 10.0000 mg | ORAL_TABLET | Freq: Every day | ORAL | Status: DC

## 2010-07-08 NOTE — Progress Notes (Signed)
Subjective:    Patient ID: Gabriella Farley, female    DOB: 05-May-1946, 65 y.o.   MRN: [id number]  HPI 65 yr old female for a cpx. She feels well. When we discussed her labs with the increase in her glucose and TG, she admits to eating a lot of candy over the past few months.    Review of Systems  Constitutional: Negative.  Negative for fever, diaphoresis, activity change, appetite change, fatigue and unexpected weight change.  HENT: Negative.  Negative for hearing loss, ear pain, nosebleeds, congestion, sore throat, trouble swallowing, neck pain, neck stiffness, voice change and tinnitus.   Eyes: Negative.  Negative for photophobia, pain, discharge, redness and visual disturbance.  Respiratory: Negative.  Negative for apnea, cough, choking, chest tightness, shortness of breath, wheezing and stridor.   Cardiovascular: Negative.  Negative for chest pain, palpitations and leg swelling.  Gastrointestinal: Negative.  Negative for nausea, vomiting, abdominal pain, diarrhea, constipation, blood in stool, abdominal distention and rectal pain.  Genitourinary: Negative.  Negative for dysuria, urgency, frequency, hematuria, flank pain, scrotal swelling, vaginal bleeding, vaginal discharge, enuresis, difficulty urinating, testicular pain, vaginal pain and menstrual problem.  Musculoskeletal: Negative.  Negative for myalgias, back pain, joint swelling, arthralgias and gait problem.  Skin: Negative.  Negative for color change, pallor, rash and wound.  Neurological: Negative.  Negative for dizziness, tremors, seizures, syncope, speech difficulty, weakness, light-headedness, numbness and headaches.  Hematological: Negative.  Negative for adenopathy. Does not bruise/bleed easily.  Psychiatric/Behavioral: Negative.  Negative for hallucinations, behavioral problems, confusion, sleep disturbance, dysphoric mood and agitation. The patient is not nervous/anxious.        Objective:   Physical Exam    Constitutional: She appears well-developed and well-nourished. No distress.  HENT:  Head: Normocephalic and atraumatic.  Right Ear: External ear normal.  Left Ear: External ear normal.  Nose: Nose normal.  Mouth/Throat: Oropharynx is clear and moist. No oropharyngeal exudate.  Eyes: Conjunctivae and EOM are normal. Pupils are equal, round, and reactive to light. Right eye exhibits no discharge. Left eye exhibits no discharge. No scleral icterus.  Neck: Normal range of motion. Neck supple. No JVD present. No thyromegaly present.  Cardiovascular: Normal rate, regular rhythm, normal heart sounds and intact distal pulses.  Exam reveals no gallop and no friction rub.   No murmur heard. Pulmonary/Chest: Effort normal and breath sounds normal. No stridor. No respiratory distress. She has no wheezes. She has no rales. She exhibits no tenderness. Right breast exhibits no inverted nipple, no mass, no nipple discharge, no skin change and no tenderness. Left breast exhibits no inverted nipple, no mass, no nipple discharge, no skin change and no tenderness. Breasts are symmetrical.  Abdominal: Soft. Normal appearance and bowel sounds are normal. She exhibits no distension, no abdominal bruit, no ascites and no mass. There is no hepatosplenomegaly. There is no tenderness. There is no rigidity, no rebound and no guarding. No hernia.  Genitourinary: Rectum normal. No breast swelling, tenderness, discharge or bleeding. Cervix exhibits no motion tenderness, no discharge and no friability. Right adnexum displays no mass, no tenderness and no fullness. Left adnexum displays no mass, no tenderness and no fullness. No erythema, tenderness or bleeding around the vagina.  Musculoskeletal: Normal range of motion. She exhibits no edema and no tenderness.  Lymphadenopathy:    She has no cervical adenopathy.  Neurological: She is alert. She has normal reflexes. No cranial nerve deficit. She exhibits normal muscle tone.  Coordination normal.  Skin: Skin is warm and  dry. No rash noted. She is not diaphoretic. No erythema. No pallor.  Psychiatric: She has a normal mood and affect. Her behavior is normal. Judgment and thought content normal.          Assessment & Plan:  Well exam. We discussed her new dx of diabetes, and the dietary changes she needs to make. She will cut back on the candy.

## 2010-10-07 NOTE — Assessment & Plan Note (Signed)
[facility] OFFICE NOTE   NAME:Gabriella Farley, Gabriella Farley                      MRN:          [id number]  DATE:06/06/2006                            DOB:          1945-11-15    This is a 65 year old woman here for a complete physical examination.  In general, she is doing well and has no complaints.  Over the past  year, she has been watching her diet and has lost 20 pounds.  She feels  fine.  She has followed her blood pressures closely at home, and when  she began to drop, she actually took herself off of Diovan several  months ago.  Her blood pressure has remained quite stable.  She had a  normal mammogram this past December.  She has not had a colonoscopy  since 2001.  For other details of her past medical history, family  history, social history, habits, etc., I refer you to our last physical  noted dated June 05, 2005.   ALLERGIES:  1. SULFA.  2. PENICILLIN.  3. CODEINE.   CURRENT MEDICATIONS:  1. Norvasc 5 mg per day.  2. Synthroid 75 mcg per day.  3. Crestor 10 mg per day.  4. Valium 2 mg b.i.d. as needed.   OBJECTIVE:  VITAL SIGNS:  Height 5 feet, 5 inches.  Weight 140.  Blood  pressure 132/82.  Pulse 72 and regular.  GENERAL:  She appears to be healthy.  SKIN:  Clear.  HEENT:  Eyes - sclerae clear.  Oropharynx is clear.  NECK:  Supple without lymphadenopathy or masses.  LUNGS:  Clear.  CARDIAC:  Rate and rhythm regular without gallops, murmurs or rubs.  Distal pulses are full.  EKG is within normal limits.  BREASTS:  Breasts and axillae are clear.  ABDOMEN:  Soft, normal bowel sounds, nontender, no masses.  PELVIC:  External genitalia within normal limits.  Vagina is clear.  Cervix is clear.  Pap smear is obtained.  Uterus is not enlarged.  Adnexa - no masses or tenderness.  RECTAL:  No masses or tenderness.  Stool Hemoccult negative.  EXTREMITIES:  No clubbing, cyanosis, or edema.  NEUROLOGIC:   Grossly intact.   LABORATORY DATA:  She was here for fasting laboratories on May 23, 2006.  These were all within normal limits, although her LDL is slightly  high at 123.  HDL is adequate at 44.  TSH is within the target range.   ASSESSMENT AND PLAN:  1. Complete physical exam.  Encouraged her to get regular exercise,      and will set her up for another screening colonoscopy.  2. Anxiety, stable.  3. Hypertension, stable.  4. Hyperlipidemia.  5. Hypothyroidism, stable.     Tera Mater. Clent Ridges, MD  Electronically Signed    SAF/MedQ  DD: 06/07/2006  DT: 06/07/2006  Job #: [id number]

## 2011-05-31 ENCOUNTER — Other Ambulatory Visit: Payer: Self-pay | Admitting: Family Medicine

## 2011-05-31 DIAGNOSIS — Z1231 Encounter for screening mammogram for malignant neoplasm of breast: Secondary | ICD-10-CM

## 2011-06-08 ENCOUNTER — Ambulatory Visit
Admission: RE | Admit: 2011-06-08 | Discharge: 2011-06-08 | Disposition: A | Discharge status: Home / Self Care | Payer: BC Managed Care – PPO | Source: Ambulatory Visit | Attending: Family Medicine | Admitting: Family Medicine

## 2011-06-08 DIAGNOSIS — Z1231 Encounter for screening mammogram for malignant neoplasm of breast: Secondary | ICD-10-CM

## 2011-07-06 ENCOUNTER — Other Ambulatory Visit (INDEPENDENT_AMBULATORY_CARE_PROVIDER_SITE_OTHER): Payer: BC Managed Care – PPO

## 2011-07-06 DIAGNOSIS — Z0000 Encounter for general adult medical examination without abnormal findings: Secondary | ICD-10-CM

## 2011-07-06 LAB — CBC WITH DIFFERENTIAL/PLATELET
Basophils Absolute: 0.1 10*3/uL (ref 0.0–0.1)
Eosinophils Absolute: 0.3 10*3/uL (ref 0.0–0.7)
HCT: 40.9 % (ref 36.0–46.0)
Lymphs Abs: 3 10*3/uL (ref 0.7–4.0)
MCV: 85.1 fl (ref 78.0–100.0)
Monocytes Absolute: 0.6 10*3/uL (ref 0.1–1.0)
Monocytes Relative: 7.5 % (ref 3.0–12.0)
Platelets: 243 10*3/uL (ref 150.0–400.0)
RDW: 14.4 % (ref 11.5–14.6)

## 2011-07-06 LAB — BASIC METABOLIC PANEL
BUN: 12 mg/dL (ref 6–23)
GFR: 75.82 mL/min (ref >60.00)
Glucose, Bld: 139 mg/dL — ABNORMAL HIGH (ref 70–99)
Potassium: 5.2 mEq/L — ABNORMAL HIGH (ref 3.5–5.1)

## 2011-07-06 LAB — LIPID PANEL
Cholesterol: 160 mg/dL (ref 0–200)
HDL: 45.6 mg/dL (ref >39.00)
LDL Cholesterol: 77 mg/dL (ref 0–99)
Triglycerides: 188 mg/dL — ABNORMAL HIGH (ref 0.0–149.0)
VLDL: 37.6 mg/dL (ref 0.0–40.0)

## 2011-07-06 LAB — POCT URINALYSIS DIPSTICK
Ketones, UA: NEGATIVE
Protein, UA: NEGATIVE
Spec Grav, UA: 1.015
pH, UA: 6

## 2011-07-06 LAB — MICROALBUMIN / CREATININE URINE RATIO: Microalb, Ur: 0.7 mg/dL (ref 0.0–1.9)

## 2011-07-06 LAB — HEPATIC FUNCTION PANEL: Total Bilirubin: 0.8 mg/dL (ref 0.3–1.2)

## 2011-07-07 NOTE — Progress Notes (Signed)
Quick Note:  Pt aware ______ 

## 2011-07-18 ENCOUNTER — Ambulatory Visit (INDEPENDENT_AMBULATORY_CARE_PROVIDER_SITE_OTHER): Payer: BC Managed Care – PPO | Admitting: Family Medicine

## 2011-07-18 ENCOUNTER — Encounter: Payer: Self-pay | Admitting: Family Medicine

## 2011-07-18 VITALS — BP 120/76 | HR 78 | Temp 98.3°F | Ht 64.0 in | Wt 155.0 lb

## 2011-07-18 DIAGNOSIS — Z0000 Encounter for general adult medical examination without abnormal findings: Secondary | ICD-10-CM

## 2011-07-18 MED ORDER — DIAZEPAM 2 MG PO TABS: 2.0000 mg | ORAL_TABLET | Freq: Four times a day (QID) | ORAL | Status: DC | PRN

## 2011-07-18 MED ORDER — AMLODIPINE BESYLATE 5 MG PO TABS: 5.0000 mg | ORAL_TABLET | Freq: Every day | ORAL | Status: DC

## 2011-07-18 MED ORDER — LEVOTHYROXINE SODIUM 75 MCG PO TABS: 75.0000 ug | ORAL_TABLET | Freq: Every day | ORAL | Status: DC

## 2011-07-18 MED ORDER — ROSUVASTATIN CALCIUM 10 MG PO TABS: 10.0000 mg | ORAL_TABLET | Freq: Every day | ORAL | Status: DC

## 2011-07-18 NOTE — Progress Notes (Signed)
  Subjective:    Patient ID: Gabriella Farley, female    DOB: [birth year], 65 y.o.   MRN: [id number]  HPI 66 yr old female for a cpx. She feels fine and has no concerns.   Review of Systems  Constitutional: Negative.   HENT: Negative.   Eyes: Negative.   Respiratory: Negative.   Cardiovascular: Negative.   Gastrointestinal: Negative.   Genitourinary: Negative for dysuria, urgency, frequency, hematuria, flank pain, decreased urine volume, enuresis, difficulty urinating, pelvic pain and dyspareunia.  Musculoskeletal: Negative.   Skin: Negative.   Neurological: Negative.   Hematological: Negative.   Psychiatric/Behavioral: Negative.        Objective:   Physical Exam  Constitutional: She is oriented to person, place, and time. She appears well-developed and well-nourished. No distress.  HENT:  Head: Normocephalic and atraumatic.  Right Ear: External ear normal.  Left Ear: External ear normal.  Nose: Nose normal.  Mouth/Throat: Oropharynx is clear and moist. No oropharyngeal exudate.  Eyes: Conjunctivae and EOM are normal. Pupils are equal, round, and reactive to light. No scleral icterus.  Neck: Normal range of motion. Neck supple. No JVD present. No thyromegaly present.  Cardiovascular: Normal rate, regular rhythm, normal heart sounds and intact distal pulses.  Exam reveals no gallop and no friction rub.   No murmur heard.      EKG normal  Pulmonary/Chest: Effort normal and breath sounds normal. No respiratory distress. She has no wheezes. She has no rales. She exhibits no tenderness.  Abdominal: Soft. Bowel sounds are normal. She exhibits no distension and no mass. There is no tenderness. There is no rebound and no guarding.  Musculoskeletal: Normal range of motion. She exhibits no edema and no tenderness.  Lymphadenopathy:    She has no cervical adenopathy.  Neurological: She is alert and oriented to person, place, and time. She has normal reflexes. No cranial nerve deficit. She  exhibits normal muscle tone. Coordination normal.  Skin: Skin is warm and dry. No rash noted. No erythema.  Psychiatric: She has a normal mood and affect. Her behavior is normal. Judgment and thought content normal.          Assessment & Plan:  Well exam.

## 2012-06-17 ENCOUNTER — Other Ambulatory Visit: Payer: Self-pay | Admitting: Family Medicine

## 2012-06-17 DIAGNOSIS — Z1231 Encounter for screening mammogram for malignant neoplasm of breast: Secondary | ICD-10-CM

## 2012-07-15 ENCOUNTER — Ambulatory Visit
Admission: RE | Admit: 2012-07-15 | Discharge: 2012-07-15 | Disposition: A | Discharge status: Home / Self Care | Payer: BC Managed Care – PPO | Source: Ambulatory Visit | Attending: Family Medicine | Admitting: Family Medicine

## 2012-07-15 ENCOUNTER — Other Ambulatory Visit (INDEPENDENT_AMBULATORY_CARE_PROVIDER_SITE_OTHER): Payer: BC Managed Care – PPO

## 2012-07-15 DIAGNOSIS — Z1231 Encounter for screening mammogram for malignant neoplasm of breast: Secondary | ICD-10-CM

## 2012-07-15 DIAGNOSIS — Z0000 Encounter for general adult medical examination without abnormal findings: Secondary | ICD-10-CM

## 2012-07-15 LAB — HEMOGLOBIN A1C: Hgb A1c MFr Bld: 7.2 % — ABNORMAL HIGH (ref 4.6–6.5)

## 2012-07-15 LAB — POCT URINALYSIS DIPSTICK
Ketones, UA: NEGATIVE
Leukocytes, UA: NEGATIVE
Nitrite, UA: NEGATIVE
Protein, UA: NEGATIVE
Urobilinogen, UA: 0.2
pH, UA: 5

## 2012-07-15 LAB — MICROALBUMIN / CREATININE URINE RATIO: Microalb, Ur: 1.3 mg/dL (ref 0.0–1.9)

## 2012-07-15 LAB — LIPID PANEL
LDL Cholesterol: 63 mg/dL (ref 0–99)
Total CHOL/HDL Ratio: 3
VLDL: 26.2 mg/dL (ref 0.0–40.0)

## 2012-07-15 LAB — CBC WITH DIFFERENTIAL/PLATELET
Basophils Absolute: 0.1 10*3/uL (ref 0.0–0.1)
HCT: 38.9 % (ref 36.0–46.0)
Lymphs Abs: 2.5 10*3/uL (ref 0.7–4.0)
MCV: 83.1 fl (ref 78.0–100.0)
Monocytes Absolute: 0.5 10*3/uL (ref 0.1–1.0)
Monocytes Relative: 7.8 % (ref 3.0–12.0)
Platelets: 235 10*3/uL (ref 150.0–400.0)
RDW: 14.6 % (ref 11.5–14.6)

## 2012-07-15 LAB — BASIC METABOLIC PANEL
BUN: 13 mg/dL (ref 6–23)
GFR: 76.66 mL/min (ref >60.00)
Glucose, Bld: 132 mg/dL — ABNORMAL HIGH (ref 70–99)
Potassium: 3.5 mEq/L (ref 3.5–5.1)

## 2012-07-15 LAB — HEPATIC FUNCTION PANEL: Total Bilirubin: 0.8 mg/dL (ref 0.3–1.2)

## 2012-07-16 NOTE — Progress Notes (Signed)
Quick Note:  Pt has CPE on 08/19/12 will go over then. ______

## 2012-07-22 ENCOUNTER — Encounter: Payer: Self-pay | Admitting: Family Medicine

## 2012-07-22 ENCOUNTER — Ambulatory Visit (INDEPENDENT_AMBULATORY_CARE_PROVIDER_SITE_OTHER): Payer: BC Managed Care – PPO | Admitting: Family Medicine

## 2012-07-22 VITALS — BP 140/76 | HR 74 | Temp 98.0°F | Ht 65.0 in | Wt 156.0 lb

## 2012-07-22 DIAGNOSIS — R0602 Shortness of breath: Secondary | ICD-10-CM

## 2012-07-22 DIAGNOSIS — Z0000 Encounter for general adult medical examination without abnormal findings: Secondary | ICD-10-CM

## 2012-07-22 MED ORDER — DIAZEPAM 2 MG PO TABS: 2.0000 mg | ORAL_TABLET | Freq: Four times a day (QID) | ORAL | Status: DC | PRN

## 2012-07-22 MED ORDER — AMLODIPINE BESYLATE 5 MG PO TABS: 5.0000 mg | ORAL_TABLET | Freq: Every day | ORAL | Status: DC

## 2012-07-22 MED ORDER — ROSUVASTATIN CALCIUM 10 MG PO TABS: 10.0000 mg | ORAL_TABLET | Freq: Every day | ORAL | Status: DC

## 2012-07-22 MED ORDER — LEVOTHYROXINE SODIUM 75 MCG PO TABS: 75.0000 ug | ORAL_TABLET | Freq: Every day | ORAL | Status: DC

## 2012-07-22 NOTE — Progress Notes (Signed)
  Subjective:    Patient ID: Gabriella Farley, female    DOB: 09/22/1945, 67 y.o.   MRN: [id number]  HPI 67 yr old female for a cpx. She had been doing well until about 3 months ago when she began to notice SOB when she exerts herself. She has never had this before. She describes a heaviness across the chest and SOB, though she denies any pain. She may also get some sweats and some nausea during these spells. When she stops to rest all these symptoms resolve quickly. No cough or fever. No GERD symptoms.    Review of Systems  Constitutional: Negative.   HENT: Negative.   Eyes: Negative.   Respiratory: Positive for chest tightness and shortness of breath. Negative for apnea, choking, wheezing and stridor.   Cardiovascular: Negative.   Gastrointestinal: Negative.   Genitourinary: Negative for dysuria, urgency, frequency, hematuria, flank pain, decreased urine volume, enuresis, difficulty urinating, pelvic pain and dyspareunia.  Musculoskeletal: Negative.   Skin: Negative.   Neurological: Negative.   Psychiatric/Behavioral: Negative.        Objective:   Physical Exam  Constitutional: She is oriented to person, place, and time. She appears well-developed and well-nourished. No distress.  HENT:  Head: Normocephalic and atraumatic.  Right Ear: External ear normal.  Left Ear: External ear normal.  Nose: Nose normal.  Mouth/Throat: Oropharynx is clear and moist. No oropharyngeal exudate.  Eyes: Conjunctivae and EOM are normal. Pupils are equal, round, and reactive to light. No scleral icterus.  Neck: Normal range of motion. Neck supple. No JVD present. No thyromegaly present.  Cardiovascular: Normal rate, regular rhythm, normal heart sounds and intact distal pulses.  Exam reveals no gallop and no friction rub.   No murmur heard. EKG normal   Pulmonary/Chest: Effort normal and breath sounds normal. No respiratory distress. She has no wheezes. She has no rales. She exhibits no tenderness.   Abdominal: Soft. Bowel sounds are normal. She exhibits no distension and no mass. There is no tenderness. There is no rebound and no guarding.  Musculoskeletal: Normal range of motion. She exhibits no edema and no tenderness.  Lymphadenopathy:    She has no cervical adenopathy.  Neurological: She is alert and oriented to person, place, and time. She has normal reflexes. No cranial nerve deficit. She exhibits normal muscle tone. Coordination normal.  Skin: Skin is warm and dry. No rash noted. No erythema.  Psychiatric: She has a normal mood and affect. Her behavior is normal. Judgment and thought content normal.          Assessment & Plan:  Well exam. Our main concern today is these spells of SOB on exertion. Being a female diabetic, she could certainly be having angina without actual chest pains. We will set her up for a CXR and a treadmill Myoview soon.

## 2012-07-23 ENCOUNTER — Ambulatory Visit (INDEPENDENT_AMBULATORY_CARE_PROVIDER_SITE_OTHER)
Admission: RE | Admit: 2012-07-23 | Discharge: 2012-07-23 | Disposition: A | Discharge status: Home / Self Care | Payer: BC Managed Care – PPO | Source: Ambulatory Visit | Attending: Family Medicine | Admitting: Family Medicine

## 2012-07-23 DIAGNOSIS — R0602 Shortness of breath: Secondary | ICD-10-CM

## 2012-07-24 NOTE — Progress Notes (Signed)
Quick Note:  I spoke with pt ______ 

## 2012-07-25 ENCOUNTER — Encounter (HOSPITAL_COMMUNITY): Payer: BC Managed Care – PPO

## 2012-07-25 ENCOUNTER — Ambulatory Visit (HOSPITAL_COMMUNITY): Payer: BC Managed Care – PPO | Attending: Cardiology | Admitting: Radiology

## 2012-07-25 VITALS — BP 158/67 | Ht 65.0 in | Wt 155.0 lb

## 2012-07-25 DIAGNOSIS — R0602 Shortness of breath: Secondary | ICD-10-CM

## 2012-07-25 DIAGNOSIS — R079 Chest pain, unspecified: Secondary | ICD-10-CM

## 2012-07-25 MED ORDER — TECHNETIUM TC 99M SESTAMIBI GENERIC - CARDIOLITE
11.0000 | Freq: Once | INTRAVENOUS | Status: AC | PRN
  Administered 2012-07-25: 11 via INTRAVENOUS

## 2012-07-25 MED ORDER — TECHNETIUM TC 99M SESTAMIBI GENERIC - CARDIOLITE
33.0000 | Freq: Once | INTRAVENOUS | Status: AC | PRN
  Administered 2012-07-25: 33 via INTRAVENOUS

## 2012-07-25 NOTE — Progress Notes (Signed)
[facility] SITE 3 NUCLEAR MED [address] [place], [place] [address] [phone]    Cardiology Nuclear Med Study  Gabriella Farley is a 67 y.o. female     MRN : [id number]     DOB: February 28, 1946  Procedure Date: 07/25/2012  Nuclear Med Background Indication for Stress Test:  Evaluation for Ischemia History:  No prior cardiac history Cardiac Risk Factors: Hypertension and Lipids  Symptoms:  Chest Pain, Diaphoresis, DOE, Nausea, Palpitations, Rapid HR and SOB   Nuclear Pre-Procedure Caffeine/Decaff Intake:  None NPO After: 7:00am   Lungs:  clear O2 Sat: 97% on room air. IV 0.9% NS with Angio Cath:  20g  IV Site: R Antecubital  IV Started by:  Stanton Kidney, EMT-P  Chest Size (in):  38 Cup Size: B  Height: 5\' 5"  (1.651 m)  Weight:  155 lb (70.308 kg)  BMI:  Body mass index is 25.79 kg/(m^2). Tech Comments:  Meds taken as directed at 7am, per patient.    Nuclear Med Study 1 or 2 day study: 1 day  Stress Test Type:  Stress  Reading MD: Cassell Clement, MD  Order Authorizing Provider:  Gershon Crane, MD  Resting Radionuclide: Technetium 44m Sestamibi  Resting Radionuclide Dose: 11.0 mCi   Stress Radionuclide:  Technetium 9m Sestamibi  Stress Radionuclide Dose: 33.0 mCi           Stress Protocol Rest HR: 62 Stress HR: 206/63  Rest BP: 158/67 Stress BP: 206/63  Exercise Time (min): 8:03 METS: 10.1   Predicted Max HR: 156 bpm % Max HR: 93.59 bpm Rate Pressure Product: 21308   Dose of Adenosine (mg):  n/a Dose of Lexiscan: n/a mg  Dose of Atropine (mg): n/a Dose of Dobutamine: n/a mcg/kg/min (at max HR)  Stress Test Technologist: Bonnita Levan, RN  Nuclear Technologist:  Domenic Polite, CNMT     Rest Procedure:  Myocardial perfusion imaging was performed at rest 45 minutes following the intravenous administration of Technetium 4m Sestamibi. Rest ECG: NSR - Normal EKG  Stress Procedure:  The patient exercised on the treadmill utilizing the Bruce Protocol for 8:03  minutes. The patient stopped due to fatigue and dyspnea and denied any chest pain.  Technetium 26m Sestamibi was injected at peak exercise and myocardial perfusion imaging was performed after a brief delay. Stress ECG: No significant change from baseline ECG  QPS Raw Data Images:  Normal; no motion artifact; normal heart/lung ratio. Stress Images:  Normal homogeneous uptake in all areas of the myocardium. Rest Images:  Normal homogeneous uptake in all areas of the myocardium. Subtraction (SDS):  Normal Transient Ischemic Dilatation (Normal <1.22):  1.04 Lung/Heart Ratio (Normal <0.45):  0.37  Quantitative Gated Spect Images QGS EDV:  68 ml QGS ESV:  15 ml  Impression Exercise Capacity:  Good exercise capacity. BP Response:  Normal blood pressure response. Clinical Symptoms:  No chest pain. ECG Impression:  No significant ST segment change suggestive of ischemia. Comparison with Prior Nuclear Study: No images to compare  Overall Impression:  Normal stress nuclear study.  LV Ejection Fraction: 79%.  LV Wall Motion:  NL LV Function; NL Wall Motion  [employer]

## 2012-07-29 NOTE — Progress Notes (Signed)
Quick Note:  I left voice message with results. ______ 

## 2013-04-24 ENCOUNTER — Other Ambulatory Visit: Payer: Self-pay | Admitting: Dermatology

## 2013-05-21 ENCOUNTER — Other Ambulatory Visit: Payer: Self-pay | Admitting: Dermatology

## 2013-06-04 ENCOUNTER — Other Ambulatory Visit: Payer: Self-pay | Admitting: Dermatology

## 2013-06-18 ENCOUNTER — Other Ambulatory Visit: Payer: Self-pay

## 2013-06-18 DIAGNOSIS — Z1231 Encounter for screening mammogram for malignant neoplasm of breast: Secondary | ICD-10-CM

## 2013-07-17 ENCOUNTER — Ambulatory Visit: Payer: BC Managed Care – PPO

## 2013-07-24 ENCOUNTER — Ambulatory Visit (INDEPENDENT_AMBULATORY_CARE_PROVIDER_SITE_OTHER): Payer: Medicare Other | Admitting: Family Medicine

## 2013-07-24 ENCOUNTER — Encounter: Payer: Self-pay | Admitting: Family Medicine

## 2013-07-24 VITALS — BP 130/70 | HR 76 | Temp 97.9°F | Ht 64.75 in | Wt 150.0 lb

## 2013-07-24 DIAGNOSIS — Z0000 Encounter for general adult medical examination without abnormal findings: Secondary | ICD-10-CM

## 2013-07-24 DIAGNOSIS — I10 Essential (primary) hypertension: Secondary | ICD-10-CM

## 2013-07-24 LAB — LIPID PANEL
Cholesterol: 145 mg/dL (ref 0–200)
HDL: 43.7 mg/dL (ref >39.00)
LDL Cholesterol: 69 mg/dL (ref 0–99)
Total CHOL/HDL Ratio: 3
Triglycerides: 160 mg/dL — ABNORMAL HIGH (ref 0.0–149.0)
VLDL: 32 mg/dL (ref 0.0–40.0)

## 2013-07-24 LAB — POCT URINALYSIS DIPSTICK
BILIRUBIN UA: NEGATIVE
Blood, UA: NEGATIVE
Glucose, UA: NEGATIVE
KETONES UA: NEGATIVE
Nitrite, UA: NEGATIVE
PH UA: 5
Protein, UA: NEGATIVE
Urobilinogen, UA: 0.2

## 2013-07-24 LAB — CBC WITH DIFFERENTIAL/PLATELET
Basophils Absolute: 0.1 10*3/uL (ref 0.0–0.1)
Basophils Relative: 1.1 % (ref 0.0–3.0)
Eosinophils Absolute: 0.2 10*3/uL (ref 0.0–0.7)
Eosinophils Relative: 2.2 % (ref 0.0–5.0)
HCT: 41.2 % (ref 36.0–46.0)
HEMOGLOBIN: 13.4 g/dL (ref 12.0–15.0)
Lymphocytes Relative: 36.9 % (ref 12.0–46.0)
Lymphs Abs: 2.6 10*3/uL (ref 0.7–4.0)
MCHC: 32.5 g/dL (ref 30.0–36.0)
MCV: 85.7 fl (ref 78.0–100.0)
Monocytes Absolute: 0.5 10*3/uL (ref 0.1–1.0)
Monocytes Relative: 7.6 % (ref 3.0–12.0)
NEUTROS ABS: 3.6 10*3/uL (ref 1.4–7.7)
NEUTROS PCT: 52.2 % (ref 43.0–77.0)
Platelets: 244 10*3/uL (ref 150.0–400.0)
RBC: 4.8 Mil/uL (ref 3.87–5.11)
RDW: 14.5 % (ref 11.5–14.6)
WBC: 7 10*3/uL (ref 4.5–10.5)

## 2013-07-24 LAB — HEPATIC FUNCTION PANEL
ALBUMIN: 4.1 g/dL (ref 3.5–5.2)
ALK PHOS: 66 U/L (ref 39–117)
ALT: 18 U/L (ref 0–35)
AST: 21 U/L (ref 0–37)
BILIRUBIN TOTAL: 0.6 mg/dL (ref 0.3–1.2)
Bilirubin, Direct: 0.1 mg/dL (ref 0.0–0.3)
Total Protein: 7.5 g/dL (ref 6.0–8.3)

## 2013-07-24 LAB — BASIC METABOLIC PANEL
BUN: 11 mg/dL (ref 6–23)
CO2: 27 mEq/L (ref 19–32)
Calcium: 8.7 mg/dL (ref 8.4–10.5)
Chloride: 102 mEq/L (ref 96–112)
Creatinine, Ser: 0.9 mg/dL (ref 0.4–1.2)
GFR: 71.25 mL/min (ref >60.00)
GLUCOSE: 112 mg/dL — AB (ref 70–99)
Potassium: 3.5 mEq/L (ref 3.5–5.1)
Sodium: 137 mEq/L (ref 135–145)

## 2013-07-24 LAB — TSH: TSH: 2.24 u[IU]/mL (ref 0.35–5.50)

## 2013-07-24 MED ORDER — ROSUVASTATIN CALCIUM 20 MG PO TABS: 20.0000 mg | ORAL_TABLET | Freq: Every day | ORAL | Status: DC

## 2013-07-24 MED ORDER — AMLODIPINE BESYLATE 5 MG PO TABS: 5.0000 mg | ORAL_TABLET | Freq: Every day | ORAL | Status: DC

## 2013-07-24 MED ORDER — LEVOTHYROXINE SODIUM 75 MCG PO TABS: 75.0000 ug | ORAL_TABLET | Freq: Every day | ORAL | Status: DC

## 2013-07-24 NOTE — Progress Notes (Signed)
Pre visit review using our clinic review tool, if applicable. No additional management support is needed unless otherwise documented below in the visit note. 

## 2013-07-24 NOTE — Progress Notes (Signed)
   Subjective:    Patient ID: Gabriella Farley, female    DOB: [birth year], 67 y.o.   MRN: [id number]  HPI 68 yr old female for a cpx. She feels well.    Review of Systems  Constitutional: Negative.   HENT: Negative.   Eyes: Negative.   Respiratory: Negative.   Cardiovascular: Negative.   Gastrointestinal: Negative.   Genitourinary: Negative for dysuria, urgency, frequency, hematuria, flank pain, decreased urine volume, enuresis, difficulty urinating, pelvic pain and dyspareunia.  Musculoskeletal: Negative.   Skin: Negative.   Neurological: Negative.   Psychiatric/Behavioral: Negative.        Objective:   Physical Exam  Constitutional: She is oriented to person, place, and time. She appears well-developed and well-nourished. No distress.  HENT:  Head: Normocephalic and atraumatic.  Right Ear: External ear normal.  Left Ear: External ear normal.  Nose: Nose normal.  Mouth/Throat: Oropharynx is clear and moist. No oropharyngeal exudate.  Eyes: Conjunctivae and EOM are normal. Pupils are equal, round, and reactive to light. No scleral icterus.  Neck: Normal range of motion. Neck supple. No JVD present. No thyromegaly present.  Cardiovascular: Normal rate, regular rhythm, normal heart sounds and intact distal pulses.  Exam reveals no gallop and no friction rub.   No murmur heard. EKG normal   Pulmonary/Chest: Effort normal and breath sounds normal. No respiratory distress. She has no wheezes. She has no rales. She exhibits no tenderness.  Abdominal: Soft. Bowel sounds are normal. She exhibits no distension and no mass. There is no tenderness. There is no rebound and no guarding.  Musculoskeletal: Normal range of motion. She exhibits no edema and no tenderness.  Lymphadenopathy:    She has no cervical adenopathy.  Neurological: She is alert and oriented to person, place, and time. She has normal reflexes. No cranial nerve deficit. She exhibits normal muscle tone. Coordination  normal.  Skin: Skin is warm and dry. No rash noted. No erythema.  Psychiatric: She has a normal mood and affect. Her behavior is normal. Judgment and thought content normal.          Assessment & Plan:  Well exam. Get fasting labs.

## 2013-07-25 ENCOUNTER — Telehealth: Payer: Self-pay | Admitting: Family Medicine

## 2013-07-25 NOTE — Telephone Encounter (Signed)
emmi report mailed to patient ° °

## 2013-07-31 ENCOUNTER — Encounter: Payer: Self-pay | Admitting: Gastroenterology

## 2013-09-09 ENCOUNTER — Ambulatory Visit (AMBULATORY_SURGERY_CENTER): Payer: Self-pay | Admitting: *Deleted

## 2013-09-09 VITALS — Ht 64.5 in | Wt 153.4 lb

## 2013-09-09 DIAGNOSIS — Z86010 Personal history of colonic polyps: Secondary | ICD-10-CM

## 2013-09-09 MED ORDER — NA SULFATE-K SULFATE-MG SULF 17.5-3.13-1.6 GM/177ML PO SOLN: ORAL | Status: DC

## 2013-09-09 NOTE — Progress Notes (Signed)
No egg or soy allergy  No home oxygen use   No medications for weight loss taken  emmi information given  Pt does have nausea after anesthesia

## 2013-09-17 ENCOUNTER — Encounter: Payer: Self-pay | Admitting: Gastroenterology

## 2013-09-23 ENCOUNTER — Encounter: Payer: Self-pay | Admitting: Gastroenterology

## 2013-09-23 ENCOUNTER — Ambulatory Visit (AMBULATORY_SURGERY_CENTER): Payer: Medicare Other | Admitting: Gastroenterology

## 2013-09-23 VITALS — BP 133/62 | HR 69 | Temp 97.2°F | Resp 33 | Ht 64.5 in | Wt 153.0 lb

## 2013-09-23 DIAGNOSIS — Z86010 Personal history of colonic polyps: Secondary | ICD-10-CM

## 2013-09-23 MED ORDER — SODIUM CHLORIDE 0.9 % IV SOLN: 500.0000 mL | INTRAVENOUS | Status: DC

## 2013-09-23 NOTE — Patient Instructions (Signed)
Discharge instructions given with verbal understanding. Normal exam. Resume previous medications. YOU HAD AN ENDOSCOPIC PROCEDURE TODAY AT THE [facility]: Refer to the procedure report that was given to you for any specific questions about what was found during the examination.  If the procedure report does not answer your questions, please call your gastroenterologist to clarify.  If you requested that your care partner not be given the details of your procedure findings, then the procedure report has been included in a sealed envelope for you to review at your convenience later.  YOU SHOULD EXPECT: Some feelings of bloating in the abdomen. Passage of more gas than usual.  Walking can help get rid of the air that was put into your GI tract during the procedure and reduce the bloating. If you had a lower endoscopy (such as a colonoscopy or flexible sigmoidoscopy) you may notice spotting of blood in your stool or on the toilet paper. If you underwent a bowel prep for your procedure, then you may not have a normal bowel movement for a few days.  DIET: Your first meal following the procedure should be a light meal and then it is ok to progress to your normal diet.  A half-sandwich or bowl of soup is an example of a good first meal.  Heavy or fried foods are harder to digest and may make you feel nauseous or bloated.  Likewise meals heavy in dairy and vegetables can cause extra gas to form and this can also increase the bloating.  Drink plenty of fluids but you should avoid alcoholic beverages for 24 hours.  ACTIVITY: Your care partner should take you home directly after the procedure.  You should plan to take it easy, moving slowly for the rest of the day.  You can resume normal activity the day after the procedure however you should NOT DRIVE or use heavy machinery for 24 hours (because of the sedation medicines used during the test).    SYMPTOMS TO REPORT IMMEDIATELY: A gastroenterologist  can be reached at any hour.  During normal business hours, 8:30 AM to 5:00 PM Monday through Friday, call [phone].  After hours and on weekends, please call the GI answering service at [phone] who will take a message and have the physician on call contact you.   Following lower endoscopy (colonoscopy or flexible sigmoidoscopy):  Excessive amounts of blood in the stool  Significant tenderness or worsening of abdominal pains  Swelling of the abdomen that is new, acute  Fever of 100F or higher  FOLLOW UP: If any biopsies were taken you will be contacted by phone or by letter within the next 1-3 weeks.  Call your gastroenterologist if you have not heard about the biopsies in 3 weeks.  Our staff will call the home number listed on your records the next business day following your procedure to check on you and address any questions or concerns that you may have at that time regarding the information given to you following your procedure. This is a courtesy call and so if there is no answer at the home number and we have not heard from you through the emergency physician on call, we will assume that you have returned to your regular daily activities without incident.  SIGNATURES/CONFIDENTIALITY: You and/or your care partner have signed paperwork which will be entered into your electronic medical record.  These signatures attest to the fact that that the information above on your After Visit Summary has been reviewed   and is understood.  Full responsibility of the confidentiality of this discharge information lies with you and/or your care-partner. 

## 2013-09-23 NOTE — Progress Notes (Signed)
Patient assist to bathroom to aide in expelling of air. Complain of abdominal discomfort. Abdominal soft and nondistended. Stated feel better. Assist to wheelchair for discharge. Patient became nausea. Assist to recliner to aide comfort. Will continue to monitor. No episode of vomiting. Dr. Deatra Ina aware of condition.

## 2013-09-23 NOTE — Op Note (Signed)
[facility]  [address] [place], [address]   COLONOSCOPY PROCEDURE REPORT  PATIENT: Gabriella Farley, Gabriella Farley  MR#: [id number] BIRTHDATE: 1946/03/10 , 18  yrs. old GENDER: Female ENDOSCOPIST: Inda Castle, MD REFERRED BY: PROCEDURE DATE:  09/23/2013 PROCEDURE:   Colonoscopy, diagnostic First Screening Colonoscopy - Avg.  risk and is 50 yrs.  old or older - No.  Prior Negative Screening - Now for repeat screening. N/A  History of Adenoma - Now for follow-up colonoscopy & has been > or = to 3 yrs.  Yes hx of adenoma.  Has been 3 or more years since last colonoscopy.  Polyps Removed Today? Yes. ASA CLASS:   Class II INDICATIONS:Patient's personal history of colon polyps. multiple colon polyps 2011 MEDICATIONS: MAC sedation, administered by CRNA and propofol (Diprivan) 200mg  IV  DESCRIPTION OF PROCEDURE:   After the risks benefits and alternatives of the procedure were thoroughly explained, informed consent was obtained.  A digital rectal exam revealed no abnormalities of the rectum.   The LB [id number]  endoscope was introduced through the anus and advanced to the cecum, which was identified by both the appendix and ileocecal valve. No adverse events experienced.   The quality of the prep was excellent using Suprep  The instrument was then slowly withdrawn as the colon was fully examined.      COLON FINDINGS: A normal appearing cecum, ileocecal valve, and appendiceal orifice were identified.  The ascending, hepatic flexure, transverse, splenic flexure, descending, sigmoid colon and rectum appeared unremarkable.  No polyps or cancers were seen. Retroflexed views revealed no abnormalities. The time to cecum=2 minutes 55 seconds.  Withdrawal time=7 minutes 0 seconds.  The scope was withdrawn and the procedure completed. COMPLICATIONS: There were no complications.  ENDOSCOPIC IMPRESSION: Normal colon  RECOMMENDATIONS: Colonoscopy 7 years   eSigned:   Inda Castle, MD 09/23/2013 9:56 AM   cc: Laurey Morale, MD   PATIENT NAME:  Gabriella Farley, Gabriella Farley MR#: [id number]

## 2013-09-23 NOTE — Progress Notes (Signed)
  [facility] Post-op Note  Patient: Gabriella Farley  Procedure(s) Performed: colonoscopy  Patient Location: [facility] - Recovery Area  Anesthesia Type: Deep Sedation/Propofol  Level of Consciousness: awake, oriented and patient cooperative  Airway and Oxygen Therapy: Patient Spontanous Breathing  Post-op Pain: none  Post-op Assessment:  Post-op Vital signs reviewed, Patient's Cardiovascular Status Stable, Respiratory Function Stable, Patent Airway, No signs of Nausea or vomiting and Pain level controlled  Post-op Vital Signs: Reviewed and stable  Complications: No apparent anesthesia complications  Melchizedek Espinola E Clarisa Danser 9:52 AM

## 2013-09-24 ENCOUNTER — Telehealth: Payer: Self-pay | Admitting: *Deleted

## 2013-09-24 NOTE — Telephone Encounter (Signed)
  Follow up Call-  Spoke with spouse.  States pt. still in bed.  States she had some nausea yesterday and some abdominal discomfort.  Also, he stated she passed a small amount of blood when she went to the bathroom. I advised him to encourage her to call back when she gets up if she feels that she is having any problems.

## 2013-10-03 HISTORY — PX: COLONOSCOPY: SHX174

## 2014-02-19 ENCOUNTER — Encounter: Payer: Self-pay | Admitting: Gastroenterology

## 2014-08-03 ENCOUNTER — Other Ambulatory Visit: Payer: Self-pay

## 2014-08-03 DIAGNOSIS — Z1231 Encounter for screening mammogram for malignant neoplasm of breast: Secondary | ICD-10-CM

## 2014-08-11 ENCOUNTER — Other Ambulatory Visit: Payer: Self-pay

## 2014-08-11 ENCOUNTER — Ambulatory Visit
Admission: RE | Admit: 2014-08-11 | Discharge: 2014-08-11 | Disposition: A | Discharge status: Home / Self Care | Payer: Medicare Other | Source: Ambulatory Visit

## 2014-08-11 DIAGNOSIS — Z1231 Encounter for screening mammogram for malignant neoplasm of breast: Secondary | ICD-10-CM

## 2014-08-27 ENCOUNTER — Ambulatory Visit (INDEPENDENT_AMBULATORY_CARE_PROVIDER_SITE_OTHER): Payer: Medicare Other | Admitting: Family Medicine

## 2014-08-27 ENCOUNTER — Encounter: Payer: Self-pay | Admitting: Family Medicine

## 2014-08-27 VITALS — BP 138/62 | HR 67 | Temp 97.9°F | Ht 64.5 in | Wt 156.0 lb

## 2014-08-27 DIAGNOSIS — Z0000 Encounter for general adult medical examination without abnormal findings: Secondary | ICD-10-CM | POA: Diagnosis not present

## 2014-08-27 DIAGNOSIS — E785 Hyperlipidemia, unspecified: Secondary | ICD-10-CM | POA: Diagnosis not present

## 2014-08-27 LAB — POCT URINALYSIS DIPSTICK
BILIRUBIN UA: NEGATIVE
Blood, UA: NEGATIVE
GLUCOSE UA: NEGATIVE
Ketones, UA: NEGATIVE
LEUKOCYTES UA: NEGATIVE
NITRITE UA: NEGATIVE
Protein, UA: NEGATIVE
Spec Grav, UA: <=1.005
UROBILINOGEN UA: 0.2
pH, UA: 5.5

## 2014-08-27 LAB — CBC WITH DIFFERENTIAL/PLATELET
Basophils Absolute: 0 10*3/uL (ref 0.0–0.1)
Basophils Relative: 0.7 % (ref 0.0–3.0)
EOS ABS: 0.2 10*3/uL (ref 0.0–0.7)
Eosinophils Relative: 2.1 % (ref 0.0–5.0)
HCT: 40.8 % (ref 36.0–46.0)
HEMOGLOBIN: 13.7 g/dL (ref 12.0–15.0)
Lymphocytes Relative: 38.5 % (ref 12.0–46.0)
Lymphs Abs: 2.7 10*3/uL (ref 0.7–4.0)
MCHC: 33.6 g/dL (ref 30.0–36.0)
MCV: 84.3 fl (ref 78.0–100.0)
MONO ABS: 0.5 10*3/uL (ref 0.1–1.0)
Monocytes Relative: 7 % (ref 3.0–12.0)
NEUTROS PCT: 51.7 % (ref 43.0–77.0)
Neutro Abs: 3.7 10*3/uL (ref 1.4–7.7)
Platelets: 245 10*3/uL (ref 150.0–400.0)
RBC: 4.84 Mil/uL (ref 3.87–5.11)
RDW: 14.2 % (ref 11.5–15.5)
WBC: 7.1 10*3/uL (ref 4.0–10.5)

## 2014-08-27 LAB — BASIC METABOLIC PANEL
BUN: 10 mg/dL (ref 6–23)
CHLORIDE: 102 meq/L (ref 96–112)
CO2: 30 mEq/L (ref 19–32)
Calcium: 9.3 mg/dL (ref 8.4–10.5)
Creatinine, Ser: 0.82 mg/dL (ref 0.40–1.20)
GFR: 74.02 mL/min (ref >60.00)
Glucose, Bld: 139 mg/dL — ABNORMAL HIGH (ref 70–99)
Potassium: 4.4 mEq/L (ref 3.5–5.1)
SODIUM: 138 meq/L (ref 135–145)

## 2014-08-27 LAB — LIPID PANEL
Cholesterol: 143 mg/dL (ref 0–200)
HDL: 47.3 mg/dL (ref >39.00)
LDL Cholesterol: 69 mg/dL (ref 0–99)
NONHDL: 95.7
Total CHOL/HDL Ratio: 3
Triglycerides: 136 mg/dL (ref 0.0–149.0)
VLDL: 27.2 mg/dL (ref 0.0–40.0)

## 2014-08-27 LAB — HEPATIC FUNCTION PANEL
ALT: 19 U/L (ref 0–35)
AST: 18 U/L (ref 0–37)
Albumin: 4.1 g/dL (ref 3.5–5.2)
Alkaline Phosphatase: 72 U/L (ref 39–117)
BILIRUBIN TOTAL: 0.7 mg/dL (ref 0.2–1.2)
Bilirubin, Direct: 0.2 mg/dL (ref 0.0–0.3)
Total Protein: 7.7 g/dL (ref 6.0–8.3)

## 2014-08-27 LAB — TSH: TSH: 2.33 u[IU]/mL (ref 0.35–4.50)

## 2014-08-27 MED ORDER — LEVOTHYROXINE SODIUM 75 MCG PO TABS: 75.0000 ug | ORAL_TABLET | Freq: Every day | ORAL | Status: DC

## 2014-08-27 MED ORDER — ROSUVASTATIN CALCIUM 20 MG PO TABS: 20.0000 mg | ORAL_TABLET | Freq: Every day | ORAL | Status: DC

## 2014-08-27 MED ORDER — AMLODIPINE BESYLATE 5 MG PO TABS: 5.0000 mg | ORAL_TABLET | Freq: Every day | ORAL | Status: DC

## 2014-08-27 NOTE — Progress Notes (Signed)
Pre visit review using our clinic review tool, if applicable. No additional management support is needed unless otherwise documented below in the visit note. 

## 2014-08-27 NOTE — Progress Notes (Signed)
   Subjective:    Patient ID: Gabriella Farley, female    DOB: 07-Apr-1946, 69 y.o.   MRN: [id number]  HPI 69 yr old female for a cpx. She feels well.    Review of Systems  Constitutional: Negative.   HENT: Negative.   Eyes: Negative.   Respiratory: Negative.   Cardiovascular: Negative.   Gastrointestinal: Negative.   Genitourinary: Negative for dysuria, urgency, frequency, hematuria, flank pain, decreased urine volume, enuresis, difficulty urinating, pelvic pain and dyspareunia.  Musculoskeletal: Negative.   Skin: Negative.   Neurological: Negative.   Psychiatric/Behavioral: Negative.        Objective:   Physical Exam  Constitutional: She is oriented to person, place, and time. She appears well-developed and well-nourished. No distress.  HENT:  Head: Normocephalic and atraumatic.  Right Ear: External ear normal.  Left Ear: External ear normal.  Nose: Nose normal.  Mouth/Throat: Oropharynx is clear and moist. No oropharyngeal exudate.  Eyes: Conjunctivae and EOM are normal. Pupils are equal, round, and reactive to light. No scleral icterus.  Neck: Normal range of motion. Neck supple. No JVD present. No thyromegaly present.  Cardiovascular: Normal rate, regular rhythm, normal heart sounds and intact distal pulses.  Exam reveals no gallop and no friction rub.   No murmur heard. EKG normal   Pulmonary/Chest: Effort normal and breath sounds normal. No respiratory distress. She has no wheezes. She has no rales. She exhibits no tenderness.  Abdominal: Soft. Bowel sounds are normal. She exhibits no distension and no mass. There is no tenderness. There is no rebound and no guarding.  Musculoskeletal: Normal range of motion. She exhibits no edema or tenderness.  Lymphadenopathy:    She has no cervical adenopathy.  Neurological: She is alert and oriented to person, place, and time. She has normal reflexes. No cranial nerve deficit. She exhibits normal muscle tone. Coordination normal.    Skin: Skin is warm and dry. No rash noted. No erythema.  Psychiatric: She has a normal mood and affect. Her behavior is normal. Judgment and thought content normal.          Assessment & Plan:  Well exam. Get fasting labs

## 2014-09-01 ENCOUNTER — Other Ambulatory Visit: Payer: Self-pay | Admitting: Family Medicine

## 2014-09-01 DIAGNOSIS — E119 Type 2 diabetes mellitus without complications: Secondary | ICD-10-CM

## 2014-09-01 DIAGNOSIS — R739 Hyperglycemia, unspecified: Secondary | ICD-10-CM

## 2014-09-02 ENCOUNTER — Other Ambulatory Visit (INDEPENDENT_AMBULATORY_CARE_PROVIDER_SITE_OTHER): Payer: Medicare Other

## 2014-09-02 DIAGNOSIS — E119 Type 2 diabetes mellitus without complications: Secondary | ICD-10-CM | POA: Diagnosis not present

## 2014-09-02 LAB — HEMOGLOBIN A1C: Hgb A1c MFr Bld: 7.5 % — ABNORMAL HIGH (ref 4.6–6.5)

## 2014-12-08 ENCOUNTER — Telehealth: Payer: Self-pay | Admitting: Family Medicine

## 2014-12-08 ENCOUNTER — Other Ambulatory Visit (INDEPENDENT_AMBULATORY_CARE_PROVIDER_SITE_OTHER): Payer: Medicare Other

## 2014-12-08 DIAGNOSIS — E119 Type 2 diabetes mellitus without complications: Secondary | ICD-10-CM

## 2014-12-08 LAB — HEMOGLOBIN A1C: HEMOGLOBIN A1C: 6.2 % (ref 4.6–6.5)

## 2014-12-08 NOTE — Telephone Encounter (Signed)
Here to check an A1c

## 2014-12-11 ENCOUNTER — Telehealth: Payer: Self-pay | Admitting: Family Medicine

## 2014-12-11 NOTE — Telephone Encounter (Signed)
I spoke with pt and went over results. 

## 2014-12-11 NOTE — Telephone Encounter (Signed)
Pt needs blood work results °

## 2015-02-15 ENCOUNTER — Other Ambulatory Visit: Payer: Self-pay | Admitting: Family Medicine

## 2015-08-12 ENCOUNTER — Ambulatory Visit (INDEPENDENT_AMBULATORY_CARE_PROVIDER_SITE_OTHER): Payer: Medicare Other | Admitting: Family Medicine

## 2015-08-12 ENCOUNTER — Encounter: Payer: Self-pay | Admitting: Family Medicine

## 2015-08-12 VITALS — BP 122/72 | HR 74 | Temp 98.3°F | Ht 64.5 in | Wt 137.0 lb

## 2015-08-12 DIAGNOSIS — S39011A Strain of muscle, fascia and tendon of abdomen, initial encounter: Secondary | ICD-10-CM

## 2015-08-12 DIAGNOSIS — M7631 Iliotibial band syndrome, right leg: Secondary | ICD-10-CM

## 2015-08-12 DIAGNOSIS — S76219A Strain of adductor muscle, fascia and tendon of unspecified thigh, initial encounter: Secondary | ICD-10-CM

## 2015-08-12 NOTE — Progress Notes (Signed)
   Subjective:    Patient ID: Gabriella Farley, female    DOB: 15-Oct-1945, 70 y.o.   MRN: [id number]  HPI hewre for right leg pain that started in February of 2016 when she was vacationing at [employer]. She remembers swimming in a pool and she felt a sharp pain in the right groin area. This pain has persisted, to one degree or another, ever since then. She thinks it as altered the way she walks, and this has led to another pain syndrome that bothers her every day. This other pain starts in the lateral right hip area and runs down the lateral leg to the middle of the shin. No swelling. She takes Ibuprofen occasionally.    Review of Systems  Constitutional: Negative.   Musculoskeletal: Positive for arthralgias and gait problem. Negative for back pain.       Objective:   Physical Exam  Constitutional: She appears well-developed and well-nourished.  She walks normally   Musculoskeletal:  Tender in the right groin at the pelvic insertion of the hip adductor muscle. Also tender along the lateral right thigh. Full ROM           Assessment & Plan:  She has a chronic groin muscle strain and has now developed an iliotibial band syndrome from walking with an altered gait. I suggested she take an Aleve BID and we will refer her to PT.

## 2015-08-12 NOTE — Progress Notes (Signed)
Pre visit review using our clinic review tool, if applicable. No additional management support is needed unless otherwise documented below in the visit note. 

## 2015-08-24 ENCOUNTER — Ambulatory Visit: Payer: Medicare Other | Attending: Family Medicine | Admitting: Physical Therapy

## 2015-08-24 DIAGNOSIS — R262 Difficulty in walking, not elsewhere classified: Secondary | ICD-10-CM | POA: Insufficient documentation

## 2015-08-24 DIAGNOSIS — M25551 Pain in right hip: Secondary | ICD-10-CM | POA: Diagnosis not present

## 2015-08-24 NOTE — Patient Instructions (Signed)
ADDUCTION: Side-Lying (Active)    Lie on right side, with top leg bent and in front of other leg. Lift straight leg up as high as possible. Complete __1_ sets of _10__ repetitions. Perform _2__ sessions per day.  http://gtsc.exer.us/129   Copyright  VHI. All rights reserved.    Butterfly, Supine    Lie on back, feet together. Lower knees toward floor. Hold _30__ seconds. Repeat __3_ times per session. Do ___2 sessions per day.  Copyright  VHI. All rights reserved.      Hamstring Stretch, Reclined (Strap, Doorframe)    Lengthen bottom leg on floor. Extend top leg along edge of doorframe or press foot up into yoga strap. Hold for _30 sec ___ breaths. Repeat _2-3 ___ times each leg.  Copyright  VHI. All rights reserved.    Hip Flexion / Knee Extension: Straight-Leg Raise (Eccentric)   Lie on back. Lift leg with knee straight. Slowly lower leg for 3-5 seconds. __10_ reps per set, __1-2_ sets per day, _5_ days per week. Lower like elevator, stopping at each floor. Marland Kitchen Rest on elbows. Rest on straight arms.  ABDUCTION: Side-Lying (Active)   Lie on left side, top leg straight. Raise top leg as far as possible. . Complete __1_ sets of _10__ repetitions. Perform _1-2__ sessions per day.  http://gtsc.exer.us/94   (Home) Extension: Hip   With support under abdomen, tighten stomach. Lift right leg in line with body. Do not hyperextend. Alternate legs. Repeat __10__ times per set. Do _2___ sets per session.

## 2015-08-24 NOTE — Therapy (Signed)
[place], [place], [address] Phone: [phone]   Fax:  [phone]  Physical Therapy Evaluation  Patient Details  Name: Gabriella Farley MRN: [id number] Date of Birth: 05/07/1946 Referring Provider: Sarajane Jews  Encounter Date: 08/24/2015      PT End of Session - 08/24/15 1146    Visit Number 1   Number of Visits 8   Date for PT Re-Evaluation 09/24/15   PT Start Time 1104   PT Stop Time 1153   PT Time Calculation (min) 49 min   Activity Tolerance Patient tolerated treatment well   Behavior During Therapy [facility] for tasks assessed/performed      Past Medical History  Diagnosis Date  . DJD (degenerative joint disease)   . Hyperlipemia   . Hypertension   . Hypothyroidism   . Migraines     history of one several years ago  . Visual floaters     retinal   sees Dr Ishmael Holter  . Allergy     slight allergy to pollen  . Anxiety     no per pt  . Cataract   . Diabetes mellitus without complication ([place])     type 2- "borderline" per pt- no medications needed    Past Surgical History  Procedure Laterality Date  . Childbirth       x 2  . Tonsillectomy    . Colonoscopy  10-29-09    per Dr. Deatra Ina, tubular adenomas, repeat in 3 yrs   . Tubal ligation      There were no vitals filed for this visit.  Visit Diagnosis:  Pain in right hip  Difficulty in walking, not elsewhere classified      Subjective Assessment - 08/24/15 1109    Subjective Pt reports pain began in Feb 2016.  Was swimming and injured Rt. groin.  Pt reports resolution of sx in groin and now it has changed to be more lateral and distal into shin.  She has had no other treatment.    Patient is accompained by: Family member   Pertinent History borderline diabetes, HTN   Limitations Standing;Walking;House hold activities;Other (comment);Sitting  sleeping   How long can you stand comfortably? 30-45 min and pain over 8/10   How long can you walk  comfortably? 10 min but every step it hurts   Diagnostic tests no imaging   Currently in Pain? Yes   Pain Score 4    Pain Location Leg   Pain Orientation Right;Proximal;Upper;Lateral;Medial   Pain Descriptors / Indicators Burning;Aching   Pain Type Chronic pain   Pain Radiating Towards lower leg    Pain Onset More than a month ago   Pain Frequency Constant   Aggravating Factors  walking, standing    Pain Relieving Factors sitting, rest, does not do OTC much, no ice or heat   Effect of Pain on Daily Activities pain is constant   Multiple Pain Sites No            [facility] PT Assessment - 08/24/15 1116    Assessment   Referring Provider Sarajane Jews   Onset Date/Surgical Date 06/25/14   Next MD Visit doe not have appt   Prior Therapy No   Precautions   Precautions None   Restrictions   Weight Bearing Restrictions No   Balance Screen   Has the patient fallen in the past 6 months No   [place] residence   Prior Function   Level  of Independence Independent   Vocation Retired   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 hip IR >ER   Strength   Right Hip Flexion 3+/5   Right Hip Extension 3/5   Right Hip ABduction 3-/5   Left Hip Flexion 3+/5  pain in R   Left Hip ABduction 5/5   Right/Left Knee --  WNL   Palpation   Palpation comment sore in ant tib,  ITB, quads, adductor and min soreness in piriformis   Special Tests   Hip Special Tests  Saralyn Pilar (FABER) Test;Ober's Test;Hip Scouring   Saralyn Pilar (FABER) Test   Findings Positive   Side Right   Comments pain Rt. lateral hip and groin   Ober's Test   Findings Negative   Side Right   Comments mild pain Rt. Gr troch   Hip Scouring   Findings Negative   Side Right   Comments mild pain with hip adduction                   [facility] Adult PT Treatment/Exercise - 08/24/15 1143    Self-Care   Self-Care RICE;Other Self-Care Comments   RICE ice pack name and number   Other Self-Care Comments  HEP, anti-inflammatory   Knee/Hip Exercises: Stretches   Active Hamstring Stretch Right;1 rep;30 seconds   Active Hamstring Stretch Limitations HEP   ITB Stretch Right;1 rep;30 seconds   ITB Stretch Limitations HEP   Other Knee/Hip Stretches adductors HEP 30 sec x 1    Knee/Hip Exercises: Supine   Other Supine Knee/Hip Exercises 4 way SLR done in flexion and demo/described verbally for HEP    Other Supine Knee/Hip Exercises butterfly stretch 60 sec HEP    Cryotherapy   Number Minutes Cryotherapy 10 Minutes   Cryotherapy Location Hip   Type of Cryotherapy Ice pack                PT Education - 08/24/15 1145    Education provided Yes   Education Details PT/POC, Self care, RICE, HEP   Person(s) Educated Patient;Spouse   Methods Explanation;Demonstration;Tactile cues;Verbal cues;Handout   Comprehension Verbalized understanding;Need further instruction             PT Long Term Goals - 08/24/15 1827    PT LONG TERM GOAL #1   Title Pt will be I with HEP    Time 4   Period Weeks   Status New   PT LONG TERM GOAL #2   Title Pt will be able to walk up to 30 min for fitness and report min occ pain in Rt. LE    Time 4   Period Weeks   Status New   PT LONG TERM GOAL #3   Title Pt will understand and use RICE for pain relief.    Time 4   Period Weeks  Status New                Plan - August 27, 2015 1820    Clinical Impression Statement Patient presents with low complexity eval for pain in Rt. hip. She will benefit from skilled PT to improve pain in hip and address difficulty in walking.  Signs and symptoms are consistent with Rt. hip bursitis due to a pronlonged period of altered gait.  Pt with ant tib pain and burning without history of umbar pathology.  She was agreeable to returning to PT  for continued treatment but originally wanted to day to be a one time visit.  She may only come back x 1 for HEP and symptom check.    Pt will benefit from skilled therapeutic intervention in order to improve on the following deficits Difficulty walking;Pain;Decreased range of motion;Decreased mobility;Decreased strength   Rehab Potential Good   PT Frequency 2x / week   PT Duration 4 weeks  Pt may only do 1 more visit due to financial   PT Treatment/Interventions Therapeutic exercise;Ultrasound;Patient/family education;Iontophoresis 4mg /ml Dexamethasone;Electrical Stimulation;Cryotherapy;Manual techniques;Neuromuscular re-education;Passive range of motion;Dry needling   PT Next Visit Plan check HEP and try ionto/anit-inflam to Rt. Gr troch, see if she would like to cont   PT Home Exercise Plan SLR x 4, SLR stretch x 3 and butterfly   Consulted and Agree with Plan of Care Patient          G-Codes - 2015-08-27 1147    Functional Assessment Tool Used clinical judgement    Functional Limitation Mobility: Walking and moving around   Mobility: Walking and Moving Around Current Status [phone]) At least 40 percent but less than 60 percent impaired, limited or restricted   Mobility: Walking and Moving Around Goal Status [phone]) At least 20 percent but less than 40 percent impaired, limited or restricted       Problem List Patient Active Problem List   Diagnosis Date Noted  . HYPOTHYROIDISM 02/26/2007  . HYPERLIPIDEMIA 02/26/2007  . ANXIETY 02/26/2007  . HYPERTENSION  02/26/2007    PAA,JENNIFER 08/27/15, 6:33 PM  [facility] [address] [place], [place], [address] Phone: [phone]   Fax:  [phone]  Name: Gabriella Farley MRN: [id number] Date of Birth: [birth year]   Raeford Razor, PT 08/27/15 6:33 PM Phone: [phone] Fax: [phone]

## 2015-09-07 ENCOUNTER — Ambulatory Visit: Payer: Medicare Other | Admitting: Physical Therapy

## 2015-09-07 DIAGNOSIS — M25551 Pain in right hip: Secondary | ICD-10-CM

## 2015-09-07 DIAGNOSIS — R262 Difficulty in walking, not elsewhere classified: Secondary | ICD-10-CM

## 2015-09-08 ENCOUNTER — Other Ambulatory Visit: Payer: Self-pay

## 2015-09-08 DIAGNOSIS — Z1231 Encounter for screening mammogram for malignant neoplasm of breast: Secondary | ICD-10-CM

## 2015-09-08 NOTE — Therapy (Signed)
[place], [place], [address] Phone: [phone]   Fax:  [phone]  Physical Therapy Treatment  Patient Details  Name: Gabriella Farley MRN: [id number] Date of Birth: 12-Sep-1945 Referring Provider: Sarajane Jews  Encounter Date: 09/07/2015      PT End of Session - 09/07/15 1424    Visit Number 2   Number of Visits 8   Date for PT Re-Evaluation 09/24/15   PT Start Time 5681   PT Stop Time 1518   PT Time Calculation (min) 61 min   Activity Tolerance Patient tolerated treatment well   Behavior During Therapy [facility] for tasks assessed/performed      Past Medical History  Diagnosis Date  . DJD (degenerative joint disease)   . Hyperlipemia   . Hypertension   . Hypothyroidism   . Migraines     history of one several years ago  . Visual floaters     retinal   sees Dr Ishmael Holter  . Allergy     slight allergy to pollen  . Anxiety     no per pt  . Cataract   . Diabetes mellitus without complication ([place])     type 2- "borderline" per pt- no medications needed    Past Surgical History  Procedure Laterality Date  . Childbirth       x 2  . Tonsillectomy    . Colonoscopy  10-29-09    per Dr. Deatra Ina, tubular adenomas, repeat in 3 yrs   . Tubal ligation      There were no vitals filed for this visit.      Subjective Assessment - 09/07/15 1419    Subjective Maybe getting a little better.  She has not been out walking much. Doing exercises regularly.    Currently in Pain? Yes   Pain Score 3    Pain Location Leg  groin, shin and lateral Rt. hip    Pain Orientation Right;Posterior   Pain Descriptors / Indicators Burning;Aching   Pain Type Chronic pain   Pain Radiating Towards lower leg    Pain Onset More than a month ago   Pain Frequency Intermittent   Effect of Pain on Daily Activities wakes her from sleep             [facility] PT Assessment - 09/08/15 0749    Marcello Moores Test    Findings Negative   Side  Right   Comments pt has difficulty maintaining hip fully extended when stretching L hamstring.  Feels tighter on Rt. but not a positive test.                      [facility] Adult PT Treatment/Exercise - 09/07/15 1428    Lumbar Exercises: Standing   Other Standing Lumbar Exercises ant hip/groin stretching: standing lunge, thomas test position and adduction in standing (wide leg) 30 sec each    Knee/Hip Exercises: Stretches   Active Hamstring Stretch Both;1 rep;30 seconds   Active Hamstring Stretch Limitations HEP   ITB Stretch Right;Both;1 rep;30 seconds   ITB Stretch Limitations HEP   Other Knee/Hip Stretches bilat. adductors HEP 30 sec x 1    Knee/Hip Exercises: Aerobic   Tread Mill 1.4 mph for 5 min, pain increased to 4/10 in hip mostly.    Knee/Hip Exercises: Supine   Bridges Limitations clam x 10 blue band    Bridges with Clamshell AAROM;Strengthening;Both;1 set;10 reps   Other Supine Knee/Hip Exercises butterfly stretch 60 sec HEP  Knee/Hip Exercises: Sidelying   Hip ABduction Strengthening;Both;1 set;10 reps   Knee/Hip Exercises: Prone   Straight Leg Raises Strengthening;Right;1 set;10 reps   Modalities   Modalities Iontophoresis   Moist Heat Therapy   Number Minutes Moist Heat 15 Minutes   Moist Heat Location Hip   Iontophoresis   Type of Iontophoresis Dexamethasone   Location L hip    Dose 1.0 cc   Time 6 hr patch                 PT Education - 09/07/15 1423    Education provided Yes   Education Details HEP technique, ice vs heat   Person(s) Educated Patient   Methods Explanation;Demonstration   Comprehension Verbalized understanding;Returned demonstration             PT Long Term Goals - 09/08/15 0752    PT LONG TERM GOAL #1   Title Pt will be I with HEP    Status On-going   PT LONG TERM GOAL #2   Title Pt will be able to walk up to 30 min for fitness and report min occ pain in Rt. LE    Status On-going   PT LONG TERM GOAL #3    Title Pt will understand and use RICE for pain relief.    Status Achieved   PT LONG TERM GOAL #4   Title Pt will demo strength in hip abd and ext 4+/5 or more bilaterally.    Time 4   Period Weeks   Status New               Plan - 09/08/15 0750    Clinical Impression Statement Sosha reports some improvement, pain is becoming more intermittent but cont to increase with walking.  Walked at 1.4 mph today and pain increased to 4/10.  Ant tib burning continues as well.  Goal for RICE met.  Added strength goal.  Working towards all other goals.    PT Next Visit Plan manual PT to Rt. hip, cont./assess ionto, strength core   PT Home Exercise Plan SLR x 4, SLR stretch x 3 and butterfly, added standing ant hip stretch    Consulted and Agree with Plan of Care Patient      Patient will benefit from skilled therapeutic intervention in order to improve the following deficits and impairments:  Difficulty walking, Pain, Decreased range of motion, Decreased mobility, Decreased strength  Visit Diagnosis: Pain in right hip  Difficulty in walking, not elsewhere classified     Problem List Patient Active Problem List   Diagnosis Date Noted  . HYPOTHYROIDISM 02/26/2007  . HYPERLIPIDEMIA 02/26/2007  . ANXIETY 02/26/2007  . HYPERTENSION 02/26/2007    PAA,JENNIFER 09/08/2015, 7:55 AM  [facility] [address] [place], [place], [address] Phone: [phone]   Fax:  [phone]  Name: Gabriella Farley MRN: [id number] Date of Birth: 05-Dec-1945    Raeford Razor, PT 09/08/2015 7:56 AM Phone: [phone] Fax: [phone]

## 2015-09-15 ENCOUNTER — Ambulatory Visit
Admission: RE | Admit: 2015-09-15 | Discharge: 2015-09-15 | Disposition: A | Discharge status: Home / Self Care | Payer: Medicare Other | Source: Ambulatory Visit

## 2015-09-15 DIAGNOSIS — Z1231 Encounter for screening mammogram for malignant neoplasm of breast: Secondary | ICD-10-CM

## 2015-09-27 ENCOUNTER — Ambulatory Visit: Payer: Medicare Other | Attending: Family Medicine | Admitting: Physical Therapy

## 2015-09-27 DIAGNOSIS — R262 Difficulty in walking, not elsewhere classified: Secondary | ICD-10-CM | POA: Diagnosis present

## 2015-09-27 DIAGNOSIS — M25551 Pain in right hip: Secondary | ICD-10-CM | POA: Insufficient documentation

## 2015-09-27 NOTE — Therapy (Signed)
[place], [place], [address] Phone: [phone]   Fax:  [phone]  Physical Therapy Treatment  Patient Details  Name: Gabriella Farley MRN: [id number] Date of Birth: Aug 20, 1945 Referring Provider: Sarajane Jews  Encounter Date: 09/27/2015      PT End of Session - 09/27/15 1104    Visit Number 3   Number of Visits 8   PT Start Time 1102   PT Stop Time 1145   PT Time Calculation (min) 43 min   Activity Tolerance Patient tolerated treatment well   Behavior During Therapy [facility] for tasks assessed/performed      Past Medical History  Diagnosis Date  . DJD (degenerative joint disease)   . Hyperlipemia   . Hypertension   . Hypothyroidism   . Migraines     history of one several years ago  . Visual floaters     retinal   sees Dr Ishmael Holter  . Allergy     slight allergy to pollen  . Anxiety     no per pt  . Cataract   . Diabetes mellitus without complication ([place])     type 2- "borderline" per pt- no medications needed    Past Surgical History  Procedure Laterality Date  . Childbirth       x 2  . Tonsillectomy    . Colonoscopy  10-29-09    per Dr. Deatra Ina, tubular adenomas, repeat in 3 yrs   . Tubal ligation      There were no vitals filed for this visit.      Subjective Assessment - 09/27/15 1103    Subjective Rt. groin not as painful, Rt. hip still feels stiff.  Get a catch at times when i turn my foot a certain way.  Some burning in lower leg but not as bad.    Currently in Pain? Yes   Pain Score 2             [facility] PT Assessment - 09/27/15 1123    Posture/Postural Control   Posture Comments Rt. leg longer by approx. 1 inch (33 inch) Lt. (32 inch)   Rt. tibia measured longer    Strength   Right Hip ABduction 4-/5             [facility] Adult PT Treatment/Exercise - 09/27/15 1123    Self-Care   Other Self-Care Comments  leg length discepancy   Lumbar Exercises: Standing   Other Standing  Lumbar Exercises ant hip/groin stretching: standing lunge, thomas test position and adduction in standing (wide leg) 30 sec each    Knee/Hip Exercises: Stretches   Active Hamstring Stretch Both;1 rep;30 seconds   Active Hamstring Stretch Limitations HEP   ITB Stretch Right;Both;1 rep;30 seconds   ITB Stretch Limitations HEP   Other Knee/Hip Stretches bilat. adductors HEP 30 sec x 1    Knee/Hip Exercises: Aerobic   Nustep Level 4 UE and LE for 6 min for strengthening   Knee/Hip Exercises: Supine   Bridges Limitations clam x 10 blue band    Knee/Hip Exercises: Sidelying   Hip ABduction Strengthening;Both;1 set;10 reps   Hip ADduction Strengthening;Both;1 set;10 reps   Knee/Hip Exercises: Prone   Straight Leg Raises Strengthening;Right;1 set;10 reps   Manual Therapy   Manual Therapy Muscle Energy Technique   Manual therapy comments added a heel lift to shorter leg (L)   Muscle Energy Technique resisted hip flexion to correct possible ilial rotation, no change noted  PT Education - 2015/10/05 1301    Education provided Yes   Education Details SIJ, leg length discrepancy, heel lift, Orthopedic , HEP   Person(s) Educated Patient   Methods Explanation;Demonstration;Handout;Verbal cues   Comprehension Verbalized understanding;Returned demonstration             PT Long Term Goals - 05-Oct-2015 1104    PT LONG TERM GOAL #1   Title Pt will be I with HEP    Status Achieved   PT LONG TERM GOAL #2   Title Pt will be able to walk up to 30 min for fitness and report min occ pain in Rt. LE    Status Achieved   PT LONG TERM GOAL #3   Title Pt will understand and use RICE for pain relief.    Status Achieved   PT LONG TERM GOAL #4   Title Pt will demo strength in hip abd and ext 4+/5 or more bilaterally.    Status Not Met               Plan - 05-Oct-2015 1313    Clinical Impression Statement Patient will be DC today due to finances.  She cannot afford co-pay.   She was found to have leg length discrepancy of about 1 inch.  Rt. leg longer than L. Pain and referral of pain likley due to irritation of SIJ.  Heel lift reduced her hip pain to a degree.  She was given info and resources to cont with HEP and may pursue a custom orthotic in the coming weeks.     PT Next Visit Plan NA   PT Home Exercise Plan SLR x 4, SLR stretch x 3 and butterfly, added standing ant hip stretch DC bridging and add hip add ball squeeze   Consulted and Agree with Plan of Care Patient      Patient will benefit from skilled therapeutic intervention in order to improve the following deficits and impairments:     Visit Diagnosis: Pain in right hip  Difficulty in walking, not elsewhere classified       G-Codes - Oct 05, 2015 1319    Functional Assessment Tool Used clinical judgement    Functional Limitation Mobility: Walking and moving around   Mobility: Walking and Moving Around Current Status [phone]) At least 20 percent but less than 40 percent impaired, limited or restricted   Mobility: Walking and Moving Around Goal Status [phone]) At least 20 percent but less than 40 percent impaired, limited or restricted   Mobility: Walking and Moving Around Discharge Status [phone]) At least 20 percent but less than 40 percent impaired, limited or restricted      Problem List Patient Active Problem List   Diagnosis Date Noted  . HYPOTHYROIDISM 02/26/2007  . HYPERLIPIDEMIA 02/26/2007  . ANXIETY 02/26/2007  . HYPERTENSION 02/26/2007    Coston Mandato 10/05/2015, 1:25 PM  [facility] [address] [place], [place], [address] Phone: [phone]   Fax:  [phone]  Name: Gabriella Farley MRN: [id number] Date of Birth: March 03, 1946    Raeford Razor, PT 2015-10-05 1:25 PM Phone: [phone] Fax: [phone]  PHYSICAL THERAPY DISCHARGE SUMMARY  Visits from Start of Care: 3  Current functional level related to goals / functional  outcomes: See above for goals met. Pt still with painful gait, inflammation in Rt. Hip, weakness in core and hips. Plans to follow up with an orthopedic MD.    Remaining deficits: See above   Education / Equipment: HEP, heel lift,  posture, alignment   Plan: Patient agrees to discharge.  Patient goals were partially met. Patient is being discharged due to financial reasons.  ?????     Raeford Razor, PT 09/27/2015 1:28 PM Phone: [phone] Fax: [phone]

## 2015-09-27 NOTE — Patient Instructions (Signed)
Adduction: Hip - Knees Together (Hook-Lying)    Lie with hips and knees bent, towel roll between knees. Push knees together. Hold for ___ seconds. Rest for ___ seconds. Repeat ___ times. Do ___ times a day.   Copyright  VHI. All rights reserved.    STOP BRIDGING BUT TRY EVERY NOW AND THEN TO GAUGE PROGRESS HEEL LIFT SI BELT  [facility]- Dr. Marlou Sa, Rhona Raider, [facility]  Keep walking but don't push through too much pain and doing stretches

## 2015-09-30 ENCOUNTER — Ambulatory Visit (INDEPENDENT_AMBULATORY_CARE_PROVIDER_SITE_OTHER): Payer: Medicare Other | Admitting: Family Medicine

## 2015-09-30 ENCOUNTER — Encounter: Payer: Self-pay | Admitting: Family Medicine

## 2015-09-30 VITALS — BP 131/64 | HR 70 | Temp 98.2°F | Ht 64.5 in | Wt 138.0 lb

## 2015-09-30 DIAGNOSIS — Z23 Encounter for immunization: Secondary | ICD-10-CM | POA: Diagnosis not present

## 2015-09-30 DIAGNOSIS — E785 Hyperlipidemia, unspecified: Secondary | ICD-10-CM | POA: Diagnosis not present

## 2015-09-30 DIAGNOSIS — Z0000 Encounter for general adult medical examination without abnormal findings: Secondary | ICD-10-CM

## 2015-09-30 DIAGNOSIS — R739 Hyperglycemia, unspecified: Secondary | ICD-10-CM | POA: Diagnosis not present

## 2015-09-30 LAB — BASIC METABOLIC PANEL
BUN: 13 mg/dL (ref 6–23)
CHLORIDE: 102 meq/L (ref 96–112)
CO2: 30 meq/L (ref 19–32)
CREATININE: 0.8 mg/dL (ref 0.40–1.20)
Calcium: 8.9 mg/dL (ref 8.4–10.5)
GFR: 75.91 mL/min (ref >60.00)
GLUCOSE: 101 mg/dL — AB (ref 70–99)
POTASSIUM: 4.3 meq/L (ref 3.5–5.1)
Sodium: 140 mEq/L (ref 135–145)

## 2015-09-30 LAB — CBC WITH DIFFERENTIAL/PLATELET
BASOS ABS: 0.1 10*3/uL (ref 0.0–0.1)
Basophils Relative: 0.7 % (ref 0.0–3.0)
EOS PCT: 2.5 % (ref 0.0–5.0)
Eosinophils Absolute: 0.2 10*3/uL (ref 0.0–0.7)
HCT: 41.1 % (ref 36.0–46.0)
Hemoglobin: 13.7 g/dL (ref 12.0–15.0)
LYMPHS ABS: 2.9 10*3/uL (ref 0.7–4.0)
Lymphocytes Relative: 36.4 % (ref 12.0–46.0)
MCHC: 33.3 g/dL (ref 30.0–36.0)
MCV: 85.7 fl (ref 78.0–100.0)
MONO ABS: 0.5 10*3/uL (ref 0.1–1.0)
MONOS PCT: 6.5 % (ref 3.0–12.0)
NEUTROS ABS: 4.3 10*3/uL (ref 1.4–7.7)
NEUTROS PCT: 53.9 % (ref 43.0–77.0)
PLATELETS: 217 10*3/uL (ref 150.0–400.0)
RBC: 4.8 Mil/uL (ref 3.87–5.11)
RDW: 14.4 % (ref 11.5–15.5)
WBC: 8 10*3/uL (ref 4.0–10.5)

## 2015-09-30 LAB — POC URINALSYSI DIPSTICK (AUTOMATED)
BILIRUBIN UA: NEGATIVE
Blood, UA: NEGATIVE
Glucose, UA: NEGATIVE
KETONES UA: NEGATIVE
LEUKOCYTES UA: NEGATIVE
Nitrite, UA: NEGATIVE
PH UA: 5
Protein, UA: NEGATIVE
Urobilinogen, UA: 0.2

## 2015-09-30 LAB — LIPID PANEL
CHOL/HDL RATIO: 3
CHOLESTEROL: 139 mg/dL (ref 0–200)
HDL: 43.1 mg/dL (ref >39.00)
LDL Cholesterol: 67 mg/dL (ref 0–99)
NonHDL: 96.15
TRIGLYCERIDES: 144 mg/dL (ref 0.0–149.0)
VLDL: 28.8 mg/dL (ref 0.0–40.0)

## 2015-09-30 LAB — HEPATIC FUNCTION PANEL
ALT: 12 U/L (ref 0–35)
AST: 16 U/L (ref 0–37)
Albumin: 4.3 g/dL (ref 3.5–5.2)
Alkaline Phosphatase: 57 U/L (ref 39–117)
BILIRUBIN TOTAL: 0.8 mg/dL (ref 0.2–1.2)
Bilirubin, Direct: 0.1 mg/dL (ref 0.0–0.3)
TOTAL PROTEIN: 7.1 g/dL (ref 6.0–8.3)

## 2015-09-30 LAB — TSH: TSH: 5.76 u[IU]/mL — AB (ref 0.35–4.50)

## 2015-09-30 LAB — HEMOGLOBIN A1C: HEMOGLOBIN A1C: 6.2 % (ref 4.6–6.5)

## 2015-09-30 MED ORDER — LEVOTHYROXINE SODIUM 75 MCG PO TABS: 75.0000 ug | ORAL_TABLET | Freq: Every day | ORAL | Status: DC

## 2015-09-30 MED ORDER — ROSUVASTATIN CALCIUM 20 MG PO TABS: 20.0000 mg | ORAL_TABLET | Freq: Every day | ORAL | Status: DC

## 2015-09-30 MED ORDER — AMLODIPINE BESYLATE 5 MG PO TABS: 5.0000 mg | ORAL_TABLET | Freq: Every day | ORAL | Status: DC

## 2015-09-30 NOTE — Progress Notes (Signed)
Pre visit review using our clinic review tool, if applicable. No additional management support is needed unless otherwise documented below in the visit note. 

## 2015-09-30 NOTE — Progress Notes (Signed)
   Subjective:    Patient ID: Gabriella Farley, female    DOB: 1945/09/14, 70 y.o.   MRN: [id number]  HPI 70 yr old female for a well exam. She feels fine except she is still going to PT for the ileotibial band syndrome.    Review of Systems  Constitutional: Negative.   HENT: Negative.   Eyes: Negative.   Respiratory: Negative.   Cardiovascular: Negative.   Gastrointestinal: Negative.   Genitourinary: Negative for dysuria, urgency, frequency, hematuria, flank pain, decreased urine volume, enuresis, difficulty urinating, pelvic pain and dyspareunia.  Musculoskeletal: Positive for myalgias and gait problem. Negative for back pain, joint swelling, arthralgias, neck pain and neck stiffness.  Skin: Negative.   Neurological: Negative.   Psychiatric/Behavioral: Negative.        Objective:   Physical Exam  Constitutional: She is oriented to person, place, and time. She appears well-developed and well-nourished. No distress.  HENT:  Head: Normocephalic and atraumatic.  Right Ear: External ear normal.  Left Ear: External ear normal.  Nose: Nose normal.  Mouth/Throat: Oropharynx is clear and moist. No oropharyngeal exudate.  Eyes: Conjunctivae and EOM are normal. Pupils are equal, round, and reactive to light. No scleral icterus.  Neck: Normal range of motion. Neck supple. No JVD present. No thyromegaly present.  Cardiovascular: Normal rate, regular rhythm, normal heart sounds and intact distal pulses.  Exam reveals no gallop and no friction rub.   No murmur heard. EKG normal   Pulmonary/Chest: Effort normal and breath sounds normal. No respiratory distress. She has no wheezes. She has no rales. She exhibits no tenderness.  Abdominal: Soft. Bowel sounds are normal. She exhibits no distension and no mass. There is no tenderness. There is no rebound and no guarding.  Musculoskeletal: Normal range of motion. She exhibits no edema or tenderness.  Lymphadenopathy:    She has no cervical  adenopathy.  Neurological: She is alert and oriented to person, place, and time. She has normal reflexes. No cranial nerve deficit. She exhibits normal muscle tone. Coordination normal.  Skin: Skin is warm and dry. No rash noted. No erythema.  Psychiatric: She has a normal mood and affect. Her behavior is normal. Judgment and thought content normal.          Assessment & Plan:  Well exam. We discussed diet and exercise.  Laurey Morale, MD

## 2015-10-05 MED ORDER — LEVOTHYROXINE SODIUM 100 MCG PO TABS: 100.0000 ug | ORAL_TABLET | Freq: Every day | ORAL | Status: DC

## 2015-10-05 NOTE — Addendum Note (Signed)
Addended by: Aggie Hacker A on: 10/05/2015 11:18 AM   Modules accepted: Orders, Medications

## 2016-03-20 ENCOUNTER — Ambulatory Visit (INDEPENDENT_AMBULATORY_CARE_PROVIDER_SITE_OTHER): Payer: Medicare Other | Admitting: Family Medicine

## 2016-03-20 DIAGNOSIS — Z23 Encounter for immunization: Secondary | ICD-10-CM

## 2016-05-24 ENCOUNTER — Telehealth: Payer: Self-pay | Admitting: Family Medicine

## 2016-05-24 NOTE — Telephone Encounter (Signed)
Pt scheduled  

## 2016-05-24 NOTE — Telephone Encounter (Signed)
I will call pt and get more information, pt might need a office visit.

## 2016-05-24 NOTE — Telephone Encounter (Signed)
Per Dr. Sarajane Jews pt needs to schedule a office visit to further evaluate. I spoke with husband Leane Para and he would like a call back to schedule this.

## 2016-05-24 NOTE — Telephone Encounter (Signed)
Pt's spouse called in stating that pt is still having pain in left upper arm at injection.  Pt wants reassurance that everything is okay and would like a call back.  Does not feel an appt is necessary at this time but wants to know if she may need an xray.  Asked if "needle broke of during injection".  I advised that it is highly unlikely that that is the case but that they would receive a call back to ease mind.

## 2016-05-29 ENCOUNTER — Encounter: Payer: Self-pay | Admitting: Family Medicine

## 2016-05-29 ENCOUNTER — Ambulatory Visit (INDEPENDENT_AMBULATORY_CARE_PROVIDER_SITE_OTHER): Payer: Medicare HMO | Admitting: Family Medicine

## 2016-05-29 VITALS — BP 159/76 | HR 77 | Temp 98.9°F | Ht 64.5 in | Wt 148.0 lb

## 2016-05-29 DIAGNOSIS — M25512 Pain in left shoulder: Secondary | ICD-10-CM | POA: Diagnosis not present

## 2016-05-29 DIAGNOSIS — M7552 Bursitis of left shoulder: Secondary | ICD-10-CM

## 2016-05-29 NOTE — Progress Notes (Signed)
   Subjective:    Patient ID: Gabriella Farley, female    DOB: 30-Mar-1946, 71 y.o.   MRN: [id number]  HPI Here for pain in the left shoulder and also in the left upper arm. She says this all started after she received a flu shot in the left upper arm on 03-20-16. She says the shot itself was more painful than usual. The area never swelled or turned red or felt warm, but she started to have a sharp pain in the lateral upper arm that persists today. Also shortly after that she began to have another pain in the anterior shoulder. She had noticed clicking and grinding in this shoulder for a year or more, but this was the first time she had pain. These pains have persisted, making it hard to put her left arm in the sleeve of a jacket or to reach behind her in the car to grab the seatbelt. She takes an occasional Ibuprofen and this helps a lot for a short period. No neck pain.   Review of Systems  Constitutional: Negative.   Musculoskeletal: Negative for back pain and neck pain.       Objective:   Physical Exam  Constitutional: She is oriented to person, place, and time. She appears well-developed and well-nourished. No distress.  Neck: Normal range of motion. Neck supple.  Musculoskeletal:  The left shoulder has a lot of crepitus present and she is tender in the anterior shoulder. She is tender over the deltoid bursa as well. ROM is full   Neurological: She is alert and oriented to person, place, and time.          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 She will use moist heat. Take Diclofenac bid for 2 weeks. (Her husband has a supply of Diclofenac 75 mg at home). Recheck if not better in 2 weeks.  Alysia Penna, MD

## 2016-05-29 NOTE — Progress Notes (Signed)
Pre visit review using our clinic review tool, if applicable. No additional management support is needed unless otherwise documented below in the visit note. 

## 2016-09-06 DIAGNOSIS — Z0120 Encounter for dental examination and cleaning without abnormal findings: Secondary | ICD-10-CM | POA: Diagnosis not present

## 2016-10-19 ENCOUNTER — Telehealth: Payer: Self-pay | Admitting: Family Medicine

## 2016-10-19 ENCOUNTER — Other Ambulatory Visit: Payer: Self-pay | Admitting: Family Medicine

## 2016-10-19 DIAGNOSIS — Z1231 Encounter for screening mammogram for malignant neoplasm of breast: Secondary | ICD-10-CM

## 2016-10-19 NOTE — Telephone Encounter (Signed)
Pt has a mammogram 6/14 and they suggest she have a bone density scan at the same time.  Pt last had one 2005.  Pt would like an order for a bone density.  Is that OK?

## 2016-10-20 ENCOUNTER — Other Ambulatory Visit: Payer: Self-pay | Admitting: Family Medicine

## 2016-10-20 DIAGNOSIS — Z0000 Encounter for general adult medical examination without abnormal findings: Secondary | ICD-10-CM

## 2016-10-20 NOTE — Telephone Encounter (Signed)
I spoke with pt, phone number is [phone] and I did fax over order.

## 2016-10-20 NOTE — Telephone Encounter (Signed)
The order is ready to be faxed  

## 2016-10-20 NOTE — Telephone Encounter (Signed)
I left a message for pt to return my call. I need the location or fax number of office.

## 2016-10-24 ENCOUNTER — Other Ambulatory Visit: Payer: Self-pay | Admitting: Family Medicine

## 2016-10-24 DIAGNOSIS — M8580 Other specified disorders of bone density and structure, unspecified site: Secondary | ICD-10-CM

## 2016-11-02 ENCOUNTER — Ambulatory Visit: Payer: Medicare HMO

## 2016-11-03 ENCOUNTER — Encounter: Payer: Self-pay | Admitting: Family Medicine

## 2016-11-03 ENCOUNTER — Ambulatory Visit (INDEPENDENT_AMBULATORY_CARE_PROVIDER_SITE_OTHER): Payer: Medicare HMO | Admitting: Family Medicine

## 2016-11-03 VITALS — BP 137/73 | HR 60 | Temp 98.2°F | Ht 64.5 in | Wt 141.0 lb

## 2016-11-03 DIAGNOSIS — E782 Mixed hyperlipidemia: Secondary | ICD-10-CM | POA: Diagnosis not present

## 2016-11-03 DIAGNOSIS — I10 Essential (primary) hypertension: Secondary | ICD-10-CM

## 2016-11-03 DIAGNOSIS — F411 Generalized anxiety disorder: Secondary | ICD-10-CM

## 2016-11-03 DIAGNOSIS — E039 Hypothyroidism, unspecified: Secondary | ICD-10-CM | POA: Diagnosis not present

## 2016-11-03 DIAGNOSIS — R739 Hyperglycemia, unspecified: Secondary | ICD-10-CM | POA: Diagnosis not present

## 2016-11-03 DIAGNOSIS — Z23 Encounter for immunization: Secondary | ICD-10-CM | POA: Diagnosis not present

## 2016-11-03 DIAGNOSIS — R69 Illness, unspecified: Secondary | ICD-10-CM | POA: Diagnosis not present

## 2016-11-03 LAB — CBC WITH DIFFERENTIAL/PLATELET
BASOS ABS: 0.1 10*3/uL (ref 0.0–0.1)
BASOS PCT: 1.2 % (ref 0.0–3.0)
EOS ABS: 0.2 10*3/uL (ref 0.0–0.7)
Eosinophils Relative: 2.9 % (ref 0.0–5.0)
HEMATOCRIT: 40.8 % (ref 36.0–46.0)
HEMOGLOBIN: 13.5 g/dL (ref 12.0–15.0)
LYMPHS PCT: 40.9 % (ref 12.0–46.0)
Lymphs Abs: 2.7 10*3/uL (ref 0.7–4.0)
MCHC: 33 g/dL (ref 30.0–36.0)
MCV: 85.5 fl (ref 78.0–100.0)
MONOS PCT: 8.5 % (ref 3.0–12.0)
Monocytes Absolute: 0.6 10*3/uL (ref 0.1–1.0)
Neutro Abs: 3.1 10*3/uL (ref 1.4–7.7)
Neutrophils Relative %: 46.5 % (ref 43.0–77.0)
Platelets: 223 10*3/uL (ref 150.0–400.0)
RBC: 4.77 Mil/uL (ref 3.87–5.11)
RDW: 14.1 % (ref 11.5–15.5)
WBC: 6.7 10*3/uL (ref 4.0–10.5)

## 2016-11-03 LAB — BASIC METABOLIC PANEL
BUN: 14 mg/dL (ref 6–23)
CHLORIDE: 102 meq/L (ref 96–112)
CO2: 30 mEq/L (ref 19–32)
CREATININE: 0.86 mg/dL (ref 0.40–1.20)
Calcium: 9.1 mg/dL (ref 8.4–10.5)
GFR: 69.6 mL/min (ref >60.00)
Glucose, Bld: 122 mg/dL — ABNORMAL HIGH (ref 70–99)
Potassium: 4 mEq/L (ref 3.5–5.1)
Sodium: 140 mEq/L (ref 135–145)

## 2016-11-03 LAB — POC URINALSYSI DIPSTICK (AUTOMATED)
Bilirubin, UA: NEGATIVE
Glucose, UA: NEGATIVE
KETONES UA: NEGATIVE
LEUKOCYTES UA: NEGATIVE
Nitrite, UA: NEGATIVE
PH UA: 6 (ref 5.0–8.0)
PROTEIN UA: NEGATIVE
RBC UA: NEGATIVE
SPEC GRAV UA: 1.01 (ref 1.010–1.025)
Urobilinogen, UA: 0.2 E.U./dL

## 2016-11-03 LAB — LIPID PANEL
CHOL/HDL RATIO: 3
Cholesterol: 143 mg/dL (ref 0–200)
HDL: 52.8 mg/dL (ref >39.00)
LDL CALC: 62 mg/dL (ref 0–99)
NonHDL: 90.33
TRIGLYCERIDES: 140 mg/dL (ref 0.0–149.0)
VLDL: 28 mg/dL (ref 0.0–40.0)

## 2016-11-03 LAB — HEPATIC FUNCTION PANEL
ALT: 11 U/L (ref 0–35)
AST: 14 U/L (ref 0–37)
Albumin: 4.3 g/dL (ref 3.5–5.2)
Alkaline Phosphatase: 67 U/L (ref 39–117)
BILIRUBIN TOTAL: 0.8 mg/dL (ref 0.2–1.2)
Bilirubin, Direct: 0.2 mg/dL (ref 0.0–0.3)
Total Protein: 7.1 g/dL (ref 6.0–8.3)

## 2016-11-03 LAB — HEMOGLOBIN A1C: Hgb A1c MFr Bld: 6.6 % — ABNORMAL HIGH (ref 4.6–6.5)

## 2016-11-03 LAB — TSH: TSH: 0.65 u[IU]/mL (ref 0.35–4.50)

## 2016-11-03 NOTE — Telephone Encounter (Signed)
Her TSH is actually at a perfect level now. Since we have corrected her low thyroid level, things are back to normal

## 2016-11-03 NOTE — Progress Notes (Signed)
   Subjective:    Patient ID: Gabriella Farley, female    DOB: July 30, 1945, 71 y.o.   MRN: [id number]  HPI Here to follow up on issues. She feels great. Her BP is stable. Her anxiety is controlled.    Review of Systems  Constitutional: Negative.   HENT: Negative.   Eyes: Negative.   Respiratory: Negative.   Cardiovascular: Negative.   Gastrointestinal: Negative.   Genitourinary: Negative for decreased urine volume, difficulty urinating, dyspareunia, dysuria, enuresis, flank pain, frequency, hematuria, pelvic pain and urgency.  Musculoskeletal: Negative.   Skin: Negative.   Neurological: Negative.   Psychiatric/Behavioral: Negative.        Objective:   Physical Exam  Constitutional: She is oriented to person, place, and time. She appears well-developed and well-nourished. No distress.  HENT:  Head: Normocephalic and atraumatic.  Right Ear: External ear normal.  Left Ear: External ear normal.  Nose: Nose normal.  Mouth/Throat: Oropharynx is clear and moist. No oropharyngeal exudate.  Eyes: Conjunctivae and EOM are normal. Pupils are equal, round, and reactive to light. No scleral icterus.  Neck: Normal range of motion. Neck supple. No JVD present. No thyromegaly present.  Cardiovascular: Normal rate, regular rhythm, normal heart sounds and intact distal pulses.  Exam reveals no gallop and no friction rub.   No murmur heard. Pulmonary/Chest: Effort normal and breath sounds normal. No respiratory distress. She has no wheezes. She has no rales. She exhibits no tenderness.  Abdominal: Soft. Bowel sounds are normal. She exhibits no distension and no mass. There is no tenderness. There is no rebound and no guarding.  Musculoskeletal: Normal range of motion. She exhibits no edema or tenderness.  Lymphadenopathy:    She has no cervical adenopathy.  Neurological: She is alert and oriented to person, place, and time. She has normal reflexes. No cranial nerve deficit. She exhibits normal  muscle tone. Coordination normal.  Skin: Skin is warm and dry. No rash noted. No erythema.  Psychiatric: She has a normal mood and affect. Her behavior is normal. Judgment and thought content normal.          Assessment & Plan:  Her anxiety is stable. Her HTN is stable. We will check her lipids, glucose and TSH today with fasting labs.  Alysia Penna, MD

## 2016-11-07 ENCOUNTER — Ambulatory Visit
Admission: RE | Admit: 2016-11-07 | Discharge: 2016-11-07 | Disposition: A | Discharge status: Home / Self Care | Payer: Medicare HMO | Source: Ambulatory Visit | Attending: Family Medicine | Admitting: Family Medicine

## 2016-11-07 DIAGNOSIS — M8580 Other specified disorders of bone density and structure, unspecified site: Secondary | ICD-10-CM

## 2016-11-07 DIAGNOSIS — Z1231 Encounter for screening mammogram for malignant neoplasm of breast: Secondary | ICD-10-CM

## 2016-11-07 DIAGNOSIS — M8589 Other specified disorders of bone density and structure, multiple sites: Secondary | ICD-10-CM | POA: Diagnosis not present

## 2016-12-07 ENCOUNTER — Telehealth: Payer: Self-pay | Admitting: Family Medicine

## 2016-12-07 NOTE — Telephone Encounter (Signed)
Received a refill request for Crestor, must confirm dosage and directions, please send to [facility].

## 2016-12-08 NOTE — Telephone Encounter (Signed)
I spoke with pt and she did confirm dose was Crestor 10 mg take 1 every day. She does not need any refills at this time, I called and left a voice message for pharmacy with this information.

## 2017-01-16 ENCOUNTER — Other Ambulatory Visit: Payer: Self-pay | Admitting: Family Medicine

## 2017-01-16 DIAGNOSIS — Z0000 Encounter for general adult medical examination without abnormal findings: Secondary | ICD-10-CM

## 2017-02-08 ENCOUNTER — Encounter: Payer: Self-pay | Admitting: Family Medicine

## 2017-06-06 ENCOUNTER — Other Ambulatory Visit: Payer: Self-pay | Admitting: Family Medicine

## 2017-06-14 ENCOUNTER — Telehealth: Payer: Self-pay

## 2017-06-14 NOTE — Telephone Encounter (Signed)
Pt stated that she is only taking the rosuvastatin 10 MG once daily she would like this refilled and not the 20 MG

## 2017-06-15 MED ORDER — ROSUVASTATIN CALCIUM 10 MG PO TABS: 10.0000 mg | ORAL_TABLET | Freq: Every day | ORAL | 0 refills | Status: DC

## 2017-06-15 NOTE — Telephone Encounter (Signed)
Rx sent in for 10 MG dose   Removed 20 MG dose from pt's med list

## 2017-08-10 ENCOUNTER — Other Ambulatory Visit: Payer: Self-pay | Admitting: Family Medicine

## 2017-08-10 DIAGNOSIS — Z0000 Encounter for general adult medical examination without abnormal findings: Secondary | ICD-10-CM

## 2017-10-22 ENCOUNTER — Other Ambulatory Visit: Payer: Self-pay | Admitting: Family Medicine

## 2017-10-22 DIAGNOSIS — Z1231 Encounter for screening mammogram for malignant neoplasm of breast: Secondary | ICD-10-CM

## 2017-10-30 DIAGNOSIS — R69 Illness, unspecified: Secondary | ICD-10-CM | POA: Diagnosis not present

## 2017-11-20 ENCOUNTER — Ambulatory Visit
Admission: RE | Admit: 2017-11-20 | Discharge: 2017-11-20 | Disposition: A | Discharge status: Home / Self Care | Payer: Medicare HMO | Source: Ambulatory Visit | Attending: Family Medicine | Admitting: Family Medicine

## 2017-11-20 DIAGNOSIS — Z1231 Encounter for screening mammogram for malignant neoplasm of breast: Secondary | ICD-10-CM | POA: Diagnosis not present

## 2017-11-23 ENCOUNTER — Ambulatory Visit (INDEPENDENT_AMBULATORY_CARE_PROVIDER_SITE_OTHER): Payer: Medicare HMO | Admitting: Family Medicine

## 2017-11-23 ENCOUNTER — Encounter: Payer: Self-pay | Admitting: Family Medicine

## 2017-11-23 VITALS — BP 122/58 | HR 73 | Temp 97.9°F | Ht 65.0 in | Wt 148.0 lb

## 2017-11-23 DIAGNOSIS — Z1331 Encounter for screening for depression: Secondary | ICD-10-CM

## 2017-11-23 DIAGNOSIS — Z0000 Encounter for general adult medical examination without abnormal findings: Secondary | ICD-10-CM

## 2017-11-23 DIAGNOSIS — G8929 Other chronic pain: Secondary | ICD-10-CM | POA: Diagnosis not present

## 2017-11-23 DIAGNOSIS — M25511 Pain in right shoulder: Secondary | ICD-10-CM

## 2017-11-23 DIAGNOSIS — I10 Essential (primary) hypertension: Secondary | ICD-10-CM | POA: Diagnosis not present

## 2017-11-23 DIAGNOSIS — E039 Hypothyroidism, unspecified: Secondary | ICD-10-CM

## 2017-11-23 DIAGNOSIS — E118 Type 2 diabetes mellitus with unspecified complications: Secondary | ICD-10-CM | POA: Insufficient documentation

## 2017-11-23 DIAGNOSIS — E782 Mixed hyperlipidemia: Secondary | ICD-10-CM

## 2017-11-23 DIAGNOSIS — R69 Illness, unspecified: Secondary | ICD-10-CM | POA: Diagnosis not present

## 2017-11-23 LAB — HEPATIC FUNCTION PANEL
ALT: 14 U/L (ref 0–35)
AST: 19 U/L (ref 0–37)
Albumin: 4.3 g/dL (ref 3.5–5.2)
Alkaline Phosphatase: 74 U/L (ref 39–117)
BILIRUBIN DIRECT: 0.1 mg/dL (ref 0.0–0.3)
BILIRUBIN TOTAL: 0.8 mg/dL (ref 0.2–1.2)
Total Protein: 7.2 g/dL (ref 6.0–8.3)

## 2017-11-23 LAB — BASIC METABOLIC PANEL
BUN: 11 mg/dL (ref 6–23)
CALCIUM: 9 mg/dL (ref 8.4–10.5)
CO2: 30 mEq/L (ref 19–32)
CREATININE: 0.81 mg/dL (ref 0.40–1.20)
Chloride: 99 mEq/L (ref 96–112)
GFR: 74.35 mL/min (ref >60.00)
Glucose, Bld: 141 mg/dL — ABNORMAL HIGH (ref 70–99)
Potassium: 4.5 mEq/L (ref 3.5–5.1)
Sodium: 137 mEq/L (ref 135–145)

## 2017-11-23 LAB — CBC WITH DIFFERENTIAL/PLATELET
BASOS ABS: 0 10*3/uL (ref 0.0–0.1)
Basophils Relative: 0.6 % (ref 0.0–3.0)
Eosinophils Absolute: 0.1 10*3/uL (ref 0.0–0.7)
Eosinophils Relative: 1.5 % (ref 0.0–5.0)
HEMATOCRIT: 41.3 % (ref 36.0–46.0)
Hemoglobin: 13.8 g/dL (ref 12.0–15.0)
LYMPHS PCT: 35.7 % (ref 12.0–46.0)
Lymphs Abs: 2.4 10*3/uL (ref 0.7–4.0)
MCHC: 33.5 g/dL (ref 30.0–36.0)
MCV: 84.6 fl (ref 78.0–100.0)
MONOS PCT: 6.2 % (ref 3.0–12.0)
Monocytes Absolute: 0.4 10*3/uL (ref 0.1–1.0)
NEUTROS ABS: 3.8 10*3/uL (ref 1.4–7.7)
Neutrophils Relative %: 56 % (ref 43.0–77.0)
Platelets: 239 10*3/uL (ref 150.0–400.0)
RBC: 4.88 Mil/uL (ref 3.87–5.11)
RDW: 13.9 % (ref 11.5–15.5)
WBC: 6.8 10*3/uL (ref 4.0–10.5)

## 2017-11-23 LAB — LIPID PANEL
CHOL/HDL RATIO: 3
Cholesterol: 142 mg/dL (ref 0–200)
HDL: 50.7 mg/dL (ref >39.00)
LDL CALC: 62 mg/dL (ref 0–99)
NONHDL: 90.8
TRIGLYCERIDES: 146 mg/dL (ref 0.0–149.0)
VLDL: 29.2 mg/dL (ref 0.0–40.0)

## 2017-11-23 LAB — POC URINALSYSI DIPSTICK (AUTOMATED)
BILIRUBIN UA: NEGATIVE
Blood, UA: NEGATIVE
Glucose, UA: NEGATIVE
Ketones, UA: NEGATIVE
LEUKOCYTES UA: NEGATIVE
NITRITE UA: NEGATIVE
PH UA: 6 (ref 5.0–8.0)
PROTEIN UA: NEGATIVE
Spec Grav, UA: 1.01 (ref 1.010–1.025)
UROBILINOGEN UA: 0.2 U/dL

## 2017-11-23 LAB — TSH: TSH: 0.7 u[IU]/mL (ref 0.35–4.50)

## 2017-11-23 LAB — HEMOGLOBIN A1C: Hgb A1c MFr Bld: 7.2 % — ABNORMAL HIGH (ref 4.6–6.5)

## 2017-11-23 MED ORDER — AMLODIPINE BESYLATE 5 MG PO TABS: 5.0000 mg | ORAL_TABLET | Freq: Every day | ORAL | 3 refills | Status: DC

## 2017-11-23 MED ORDER — LEVOTHYROXINE SODIUM 100 MCG PO TABS: 100.0000 ug | ORAL_TABLET | Freq: Every day | ORAL | 3 refills | Status: DC

## 2017-11-23 MED ORDER — ROSUVASTATIN CALCIUM 10 MG PO TABS: 10.0000 mg | ORAL_TABLET | Freq: Every day | ORAL | 3 refills | Status: DC

## 2017-11-23 NOTE — Progress Notes (Signed)
Subjective:    Patient ID: Gabriella Farley, female    DOB: 1946/01/02, 72 y.o.   MRN: [id number]  HPI Here to follow up on issues and to discuss right shoulder pain. Her BP has been stable. She asks for a glucometer to follow her glucoses at home. She says that 3 months ago she slipped while coming down some steps to her attic. She was able to catch herself from falling, but she wrenched her right arm and shoulder. The shoulder has had pain in it ever since. She takes Ibuprofen sometimes with relief.    Review of Systems  Constitutional: Negative.  Negative for activity change, appetite change, diaphoresis, fatigue, fever and unexpected weight change.  HENT: Negative.  Negative for congestion, ear pain, hearing loss, nosebleeds, sore throat, tinnitus, trouble swallowing and voice change.   Eyes: Negative.  Negative for photophobia, pain, discharge, redness and visual disturbance.  Respiratory: Negative.  Negative for apnea, cough, choking, chest tightness, shortness of breath, wheezing and stridor.   Cardiovascular: Negative.  Negative for chest pain, palpitations and leg swelling.  Gastrointestinal: Negative.  Negative for abdominal distention, abdominal pain, blood in stool, constipation, diarrhea, nausea, rectal pain and vomiting.  Genitourinary: Negative.  Negative for difficulty urinating, dysuria, enuresis, flank pain, frequency, hematuria, menstrual problem, urgency, vaginal bleeding, vaginal discharge and vaginal pain.  Musculoskeletal: Positive for arthralgias. Negative for back pain, gait problem, joint swelling, myalgias, neck pain and neck stiffness.  Skin: Negative.  Negative for color change, pallor, rash and wound.  Neurological: Negative.  Negative for dizziness, tremors, seizures, syncope, speech difficulty, weakness, light-headedness, numbness and headaches.  Hematological: Negative for adenopathy. Does not bruise/bleed easily.  Psychiatric/Behavioral: Negative.  Negative for  agitation, behavioral problems, confusion, dysphoric mood, hallucinations and sleep disturbance. The patient is not nervous/anxious.        Objective:   Physical Exam  Constitutional: She is oriented to person, place, and time. She appears well-developed and well-nourished. No distress.  HENT:  Head: Normocephalic and atraumatic.  Right Ear: External ear normal.  Left Ear: External ear normal.  Nose: Nose normal.  Mouth/Throat: Oropharynx is clear and moist. No oropharyngeal exudate.  Eyes: Pupils are equal, round, and reactive to light. Conjunctivae and EOM are normal. Right eye exhibits no discharge. Left eye exhibits no discharge. No scleral icterus.  Neck: Normal range of motion. Neck supple. No JVD present. No thyromegaly present.  Cardiovascular: Normal rate, regular rhythm, normal heart sounds and intact distal pulses. Exam reveals no gallop and no friction rub.  No murmur heard. Pulmonary/Chest: Effort normal and breath sounds normal. No stridor. No respiratory distress. She has no wheezes. She has no rales. She exhibits no tenderness. No breast tenderness, discharge or bleeding.  Abdominal: Soft. Normal appearance and bowel sounds are normal. She exhibits no distension, no abdominal bruit, no ascites and no mass. There is no hepatosplenomegaly. There is no tenderness. There is no rigidity, no rebound and no guarding. No hernia.  Genitourinary: Rectum normal. No breast tenderness, discharge or bleeding. Cervix exhibits no motion tenderness, no discharge and no friability. Right adnexum displays no mass, no tenderness and no fullness. Left adnexum displays no mass, no tenderness and no fullness. No erythema, tenderness or bleeding in the vagina.  Musculoskeletal: She exhibits no edema.  The right shoulder is tender anteriorly and posteriorly. ROM is limited by pain. No crepitus   Lymphadenopathy:    She has no cervical adenopathy.  Neurological: She is alert and oriented to person,  place, and time. She has normal reflexes. She displays normal reflexes. No cranial nerve deficit. She exhibits normal muscle tone. Coordination normal.  Skin: Skin is warm and dry. No rash noted. She is not diaphoretic. No erythema. No pallor.  Psychiatric: She has a normal mood and affect. Her behavior is normal. Judgment and thought content normal.          Assessment & Plan:  Her HTN is stable. Get fasting labs to check lipids and A1c, etc. We will write for her to get a home glucometer. The right shoulder injury is likely to be either a rotator cuff tear or a labrum tear. Refer to Orthopedics.  Alysia Penna, MD

## 2017-11-28 ENCOUNTER — Telehealth: Payer: Self-pay | Admitting: Family Medicine

## 2017-11-28 NOTE — Telephone Encounter (Signed)
Copied from [place] [id number]. Topic: Quick Communication - Lab Results >> Nov 28, 2017 11:27 AM Myriam Forehand, [place] wrote: Called patient to inform them of  lab results. When patient returns call, triage nurse may disclose results.  Patient called and states she is returning a missed call in reference to lab results. CB# [phone]

## 2017-11-28 NOTE — Telephone Encounter (Signed)
Charted in result notes. 

## 2017-12-01 DIAGNOSIS — M67911 Unspecified disorder of synovium and tendon, right shoulder: Secondary | ICD-10-CM | POA: Diagnosis not present

## 2017-12-28 DIAGNOSIS — H2513 Age-related nuclear cataract, bilateral: Secondary | ICD-10-CM | POA: Diagnosis not present

## 2017-12-28 DIAGNOSIS — E119 Type 2 diabetes mellitus without complications: Secondary | ICD-10-CM | POA: Diagnosis not present

## 2017-12-28 DIAGNOSIS — H04123 Dry eye syndrome of bilateral lacrimal glands: Secondary | ICD-10-CM | POA: Diagnosis not present

## 2017-12-28 DIAGNOSIS — H5213 Myopia, bilateral: Secondary | ICD-10-CM | POA: Diagnosis not present

## 2017-12-28 LAB — HM DIABETES EYE EXAM

## 2018-01-30 ENCOUNTER — Encounter: Payer: Self-pay | Admitting: Family Medicine

## 2018-01-30 ENCOUNTER — Ambulatory Visit (INDEPENDENT_AMBULATORY_CARE_PROVIDER_SITE_OTHER): Payer: Medicare HMO | Admitting: Family Medicine

## 2018-01-30 VITALS — BP 120/60 | HR 92 | Temp 98.2°F | Ht 65.0 in | Wt 143.2 lb

## 2018-01-30 DIAGNOSIS — E118 Type 2 diabetes mellitus with unspecified complications: Secondary | ICD-10-CM | POA: Diagnosis not present

## 2018-01-30 DIAGNOSIS — L237 Allergic contact dermatitis due to plants, except food: Secondary | ICD-10-CM

## 2018-01-30 MED ORDER — TRIAMCINOLONE ACETONIDE 0.1 % EX CREA: 1.0000 "application " | TOPICAL_CREAM | Freq: Three times a day (TID) | CUTANEOUS | 0 refills | Status: DC

## 2018-01-30 NOTE — Progress Notes (Signed)
   Subjective:    Patient ID: Gabriella Farley, female    DOB: [birth year], 72 y.o.   MRN: [id number]  HPI Here for an itchy rash on the right lower leg that appeared 10 days ago. It started as red bumps and now is a flat red area. No pain. Using Neosporin.    Review of Systems  Constitutional: Negative.   Respiratory: Negative.   Cardiovascular: Negative.   Skin: Positive for rash.       Objective:   Physical Exam  Constitutional: She appears well-developed and well-nourished.  Cardiovascular: Normal rate, regular rhythm, normal heart sounds and intact distal pulses.  Pulmonary/Chest: Effort normal and breath sounds normal.  Skin:  The anterior right lower leg has an area of macular erythema           Assessment & Plan:  Contact dermatitis, apply Triamcinolone cream prn.  Alysia Penna, MD

## 2018-02-04 ENCOUNTER — Other Ambulatory Visit: Payer: Medicare HMO

## 2018-03-04 ENCOUNTER — Other Ambulatory Visit (INDEPENDENT_AMBULATORY_CARE_PROVIDER_SITE_OTHER): Payer: Medicare HMO

## 2018-03-04 DIAGNOSIS — E118 Type 2 diabetes mellitus with unspecified complications: Secondary | ICD-10-CM | POA: Diagnosis not present

## 2018-03-04 LAB — HEMOGLOBIN A1C: Hgb A1c MFr Bld: 6.8 % — ABNORMAL HIGH (ref 4.6–6.5)

## 2018-03-06 ENCOUNTER — Encounter: Payer: Self-pay | Admitting: *Deleted

## 2018-05-13 ENCOUNTER — Ambulatory Visit: Payer: Self-pay

## 2018-05-13 NOTE — Telephone Encounter (Signed)
FYI

## 2018-05-13 NOTE — Telephone Encounter (Signed)
Patient called in with c/o CP, the [facility] transferred the call to me and the patient says she did not want to speak to a nurse, she only wants to see Dr. Sarajane Jews and she wants an appointment. I asked the patient did she want to speak to me, she says no, I want an appointment. Appointment scheduled by [place], [place].

## 2018-05-16 ENCOUNTER — Ambulatory Visit (INDEPENDENT_AMBULATORY_CARE_PROVIDER_SITE_OTHER): Payer: Medicare HMO | Admitting: Family Medicine

## 2018-05-16 ENCOUNTER — Encounter: Payer: Self-pay | Admitting: Family Medicine

## 2018-05-16 VITALS — BP 140/82 | HR 66 | Temp 97.8°F | Wt 141.4 lb

## 2018-05-16 DIAGNOSIS — M940 Chondrocostal junction syndrome [Tietze]: Secondary | ICD-10-CM | POA: Diagnosis not present

## 2018-05-16 DIAGNOSIS — R079 Chest pain, unspecified: Secondary | ICD-10-CM | POA: Diagnosis not present

## 2018-05-16 MED ORDER — DICLOFENAC SODIUM 75 MG PO TBEC: 75.0000 mg | DELAYED_RELEASE_TABLET | Freq: Two times a day (BID) | ORAL | 2 refills | Status: DC

## 2018-05-16 NOTE — Progress Notes (Signed)
   Subjective:    Patient ID: Gabriella Farley, female    DOB: 02/07/1946, 72 y.o.   MRN: [id number]  HPI Here for a chest pain that started 4 weeks ago and it has bothered her daily since then. This is a sharp pain that comes and goes. It is centered in the left anterior chest but can radiate around to the center of the back between the shoulder blades. No hx of trauma. No SOB. The pain is worse when she is sitting still or lying in bed, better when she moves around. No heartburn.    Review of Systems  Constitutional: Negative.   Respiratory: Negative.   Cardiovascular: Positive for chest pain. Negative for palpitations and leg swelling.  Musculoskeletal: Positive for back pain.  Neurological: Negative.        Objective:   Physical Exam Constitutional:      General: She is not in acute distress.    Appearance: Normal appearance.  Neck:     Musculoskeletal: Normal range of motion.  Cardiovascular:     Rate and Rhythm: Normal rate and regular rhythm.     Pulses: Normal pulses.     Heart sounds: Normal heart sounds.     Comments: EKG today is normal  Pulmonary:     Effort: Pulmonary effort is normal. No respiratory distress.     Breath sounds: Normal breath sounds. No stridor. No wheezing, rhonchi or rales.  Musculoskeletal:     Comments: She is very tender over the left lower sternal margin   Lymphadenopathy:     Cervical: No cervical adenopathy.  Neurological:     General: No focal deficit present.     Mental Status: She is alert and oriented to person, place, and time.           Assessment & Plan:  Costochondritis, treat with Diclofenac. Recheck prn. Alysia Penna, MD

## 2018-08-16 ENCOUNTER — Other Ambulatory Visit: Payer: Self-pay | Admitting: Family Medicine

## 2018-08-16 DIAGNOSIS — Z0000 Encounter for general adult medical examination without abnormal findings: Secondary | ICD-10-CM

## 2018-11-26 ENCOUNTER — Ambulatory Visit (INDEPENDENT_AMBULATORY_CARE_PROVIDER_SITE_OTHER): Payer: Medicare HMO | Admitting: Family Medicine

## 2018-11-26 ENCOUNTER — Encounter: Payer: Self-pay | Admitting: Family Medicine

## 2018-11-26 ENCOUNTER — Ambulatory Visit: Payer: Medicare HMO | Admitting: Family Medicine

## 2018-11-26 ENCOUNTER — Other Ambulatory Visit: Payer: Self-pay

## 2018-11-26 VITALS — BP 138/64 | HR 67 | Temp 98.0°F | Ht 66.0 in | Wt 144.0 lb

## 2018-11-26 DIAGNOSIS — E118 Type 2 diabetes mellitus with unspecified complications: Secondary | ICD-10-CM

## 2018-11-26 DIAGNOSIS — Z0000 Encounter for general adult medical examination without abnormal findings: Secondary | ICD-10-CM | POA: Diagnosis not present

## 2018-11-26 DIAGNOSIS — Z209 Contact with and (suspected) exposure to unspecified communicable disease: Secondary | ICD-10-CM

## 2018-11-26 LAB — POC URINALSYSI DIPSTICK (AUTOMATED)
Bilirubin, UA: NEGATIVE
Blood, UA: NEGATIVE
Glucose, UA: NEGATIVE
Ketones, UA: NEGATIVE
Leukocytes, UA: NEGATIVE
Nitrite, UA: NEGATIVE
Protein, UA: NEGATIVE
Spec Grav, UA: <=1.005 — AB (ref 1.010–1.025)
Urobilinogen, UA: 0.2 E.U./dL
pH, UA: 6 (ref 5.0–8.0)

## 2018-11-26 LAB — HEMOGLOBIN A1C: Hgb A1c MFr Bld: 6.8 % — ABNORMAL HIGH (ref 4.6–6.5)

## 2018-11-26 LAB — HEPATIC FUNCTION PANEL
ALT: 10 U/L (ref 0–35)
AST: 13 U/L (ref 0–37)
Albumin: 4.3 g/dL (ref 3.5–5.2)
Alkaline Phosphatase: 78 U/L (ref 39–117)
Bilirubin, Direct: 0.1 mg/dL (ref 0.0–0.3)
Total Bilirubin: 0.6 mg/dL (ref 0.2–1.2)
Total Protein: 7.1 g/dL (ref 6.0–8.3)

## 2018-11-26 LAB — CBC WITH DIFFERENTIAL/PLATELET
Basophils Absolute: 0.1 10*3/uL (ref 0.0–0.1)
Basophils Relative: 1.1 % (ref 0.0–3.0)
Eosinophils Absolute: 0.1 10*3/uL (ref 0.0–0.7)
Eosinophils Relative: 2.2 % (ref 0.0–5.0)
HCT: 40.1 % (ref 36.0–46.0)
Hemoglobin: 13.3 g/dL (ref 12.0–15.0)
Lymphocytes Relative: 39.1 % (ref 12.0–46.0)
Lymphs Abs: 2.6 10*3/uL (ref 0.7–4.0)
MCHC: 33.2 g/dL (ref 30.0–36.0)
MCV: 85.2 fl (ref 78.0–100.0)
Monocytes Absolute: 0.6 10*3/uL (ref 0.1–1.0)
Monocytes Relative: 8.8 % (ref 3.0–12.0)
Neutro Abs: 3.2 10*3/uL (ref 1.4–7.7)
Neutrophils Relative %: 48.8 % (ref 43.0–77.0)
Platelets: 208 10*3/uL (ref 150.0–400.0)
RBC: 4.7 Mil/uL (ref 3.87–5.11)
RDW: 14.2 % (ref 11.5–15.5)
WBC: 6.6 10*3/uL (ref 4.0–10.5)

## 2018-11-26 LAB — BASIC METABOLIC PANEL
BUN: 10 mg/dL (ref 6–23)
CO2: 29 mEq/L (ref 19–32)
Calcium: 8.5 mg/dL (ref 8.4–10.5)
Chloride: 100 mEq/L (ref 96–112)
Creatinine, Ser: 0.84 mg/dL (ref 0.40–1.20)
GFR: 66.88 mL/min (ref >60.00)
Glucose, Bld: 126 mg/dL — ABNORMAL HIGH (ref 70–99)
Potassium: 3.9 mEq/L (ref 3.5–5.1)
Sodium: 138 mEq/L (ref 135–145)

## 2018-11-26 LAB — LIPID PANEL
Cholesterol: 137 mg/dL (ref 0–200)
HDL: 54.7 mg/dL (ref >39.00)
LDL Cholesterol: 57 mg/dL (ref 0–99)
NonHDL: 82.05
Total CHOL/HDL Ratio: 3
Triglycerides: 125 mg/dL (ref 0.0–149.0)
VLDL: 25 mg/dL (ref 0.0–40.0)

## 2018-11-26 LAB — TSH: TSH: 0.25 u[IU]/mL — ABNORMAL LOW (ref 0.35–4.50)

## 2018-11-26 MED ORDER — AMLODIPINE BESYLATE 5 MG PO TABS: 5.0000 mg | ORAL_TABLET | Freq: Every day | ORAL | 3 refills | Status: DC

## 2018-11-26 MED ORDER — LEVOTHYROXINE SODIUM 100 MCG PO TABS: 100.0000 ug | ORAL_TABLET | Freq: Every day | ORAL | 3 refills | Status: DC

## 2018-11-26 MED ORDER — ROSUVASTATIN CALCIUM 10 MG PO TABS: 10.0000 mg | ORAL_TABLET | Freq: Every day | ORAL | 3 refills | Status: DC

## 2018-11-26 NOTE — Progress Notes (Signed)
   Subjective:    Patient ID: Gabriella Farley, female    DOB: Aug 23, 1945, 73 y.o.   MRN: [id number]  HPI Here for a well exam. She feels fine but does mention some mild pain in the bottom of the left heel that comes and goes. It is worse when she goes barefoot.    Review of Systems  Constitutional: Negative.   HENT: Negative.   Eyes: Negative.   Respiratory: Negative.   Cardiovascular: Negative.   Gastrointestinal: Negative.   Genitourinary: Negative for decreased urine volume, difficulty urinating, dyspareunia, dysuria, enuresis, flank pain, frequency, hematuria, pelvic pain and urgency.  Musculoskeletal: Negative.   Skin: Negative.   Neurological: Negative.   Psychiatric/Behavioral: Negative.        Objective:   Physical Exam Constitutional:      General: She is not in acute distress.    Appearance: She is well-developed.  HENT:     Head: Normocephalic and atraumatic.     Right Ear: External ear normal.     Left Ear: External ear normal.     Nose: Nose normal.     Mouth/Throat:     Pharynx: No oropharyngeal exudate.  Eyes:     General: No scleral icterus.    Conjunctiva/sclera: Conjunctivae normal.     Pupils: Pupils are equal, round, and reactive to light.  Neck:     Musculoskeletal: Normal range of motion and neck supple.     Thyroid: No thyromegaly.     Vascular: No JVD.  Cardiovascular:     Rate and Rhythm: Normal rate and regular rhythm.     Heart sounds: Normal heart sounds. No murmur. No friction rub. No gallop.   Pulmonary:     Effort: Pulmonary effort is normal. No respiratory distress.     Breath sounds: Normal breath sounds. No wheezing or rales.  Chest:     Chest wall: No tenderness.  Abdominal:     General: Bowel sounds are normal. There is no distension.     Palpations: Abdomen is soft. There is no mass.     Tenderness: There is no abdominal tenderness. There is no guarding or rebound.  Musculoskeletal: Normal range of motion.     Comments: She  is tender at the bottom of the left heel. No erythema or warmth   Lymphadenopathy:     Cervical: No cervical adenopathy.  Skin:    General: Skin is warm and dry.     Findings: No erythema or rash.  Neurological:     Mental Status: She is alert and oriented to person, place, and time.     Cranial Nerves: No cranial nerve deficit.     Motor: No abnormal muscle tone.     Coordination: Coordination normal.     Deep Tendon Reflexes: Reflexes are normal and symmetric. Reflexes normal.  Psychiatric:        Behavior: Behavior normal.        Thought Content: Thought content normal.        Judgment: Judgment normal.           Assessment & Plan:  Well exam. We discussed diet and exercise. Get fasting labs. She also has a touch of plantar fasciitis. I advised her to always wear supportive shoes like athletic shoes.  Alysia Penna, MD

## 2018-11-27 LAB — HEPATITIS C ANTIBODY
Hepatitis C Ab: NONREACTIVE
SIGNAL TO CUT-OFF: 0.01 (ref <1.00)

## 2018-11-28 ENCOUNTER — Encounter: Payer: Self-pay | Admitting: *Deleted

## 2018-11-29 ENCOUNTER — Telehealth: Payer: Self-pay | Admitting: *Deleted

## 2018-11-29 NOTE — Telephone Encounter (Signed)
Copied from [place] [id number]. Topic: General - Other >> Nov 29, 2018 11:09 AM Yvette Rack wrote: Reason for CRM: Pt called in for lab results. Pt requests call back.   Called patient. No answer. LVM that results have been sent to MyChart, if she can't access call office back.

## 2018-12-03 NOTE — Telephone Encounter (Signed)
Yes her thyroid level is right where we want it to be

## 2018-12-06 ENCOUNTER — Encounter: Payer: Self-pay | Admitting: Family Medicine

## 2018-12-09 MED ORDER — LEVOTHYROXINE SODIUM 75 MCG PO TABS: 75.0000 ug | ORAL_TABLET | Freq: Every day | ORAL | 3 refills | Status: DC

## 2018-12-09 NOTE — Telephone Encounter (Signed)
Sometimes I like to push the dose of Synthroid a little because patients feel better, but we can certainly decrease the dose a little. Change this to 75 mcg daily. Call in #90 with 3 rf. Recheck levels in 90 days

## 2018-12-11 ENCOUNTER — Telehealth: Payer: Self-pay

## 2018-12-11 NOTE — Telephone Encounter (Signed)
Copied from [place] [id number]. Topic: General - Other >> Nov 29, 2018 11:09 AM Yvette Rack wrote: Reason for CRM: Pt called in for lab results. Pt requests call back. >> Nov 29, 2018  3:59 PM Leward Quan A wrote: Patient called to speak to Roselyn Reef states that she have some questions please call patient at Ph# [phone]

## 2018-12-11 NOTE — Telephone Encounter (Signed)
See 12/06/18 Pt message for further documentation.

## 2019-01-20 ENCOUNTER — Other Ambulatory Visit: Payer: Self-pay | Admitting: Family Medicine

## 2019-01-20 ENCOUNTER — Other Ambulatory Visit: Payer: Self-pay

## 2019-01-20 ENCOUNTER — Ambulatory Visit
Admission: RE | Admit: 2019-01-20 | Discharge: 2019-01-20 | Disposition: A | Discharge status: Home / Self Care | Payer: Medicare HMO | Source: Ambulatory Visit | Attending: Family Medicine | Admitting: Family Medicine

## 2019-01-20 DIAGNOSIS — Z1231 Encounter for screening mammogram for malignant neoplasm of breast: Secondary | ICD-10-CM

## 2019-01-23 DIAGNOSIS — R69 Illness, unspecified: Secondary | ICD-10-CM | POA: Diagnosis not present

## 2019-02-20 ENCOUNTER — Other Ambulatory Visit: Payer: Self-pay

## 2019-02-20 DIAGNOSIS — Z20828 Contact with and (suspected) exposure to other viral communicable diseases: Secondary | ICD-10-CM | POA: Diagnosis not present

## 2019-02-20 DIAGNOSIS — Z20822 Contact with and (suspected) exposure to covid-19: Secondary | ICD-10-CM

## 2019-02-21 LAB — NOVEL CORONAVIRUS, NAA: SARS-CoV-2, NAA: NOT DETECTED

## 2019-03-13 DIAGNOSIS — R69 Illness, unspecified: Secondary | ICD-10-CM | POA: Diagnosis not present

## 2019-05-02 ENCOUNTER — Other Ambulatory Visit (INDEPENDENT_AMBULATORY_CARE_PROVIDER_SITE_OTHER): Payer: Medicare HMO

## 2019-05-02 ENCOUNTER — Other Ambulatory Visit: Payer: Self-pay

## 2019-05-02 ENCOUNTER — Telehealth (INDEPENDENT_AMBULATORY_CARE_PROVIDER_SITE_OTHER): Payer: Medicare HMO | Admitting: Family Medicine

## 2019-05-02 DIAGNOSIS — R300 Dysuria: Secondary | ICD-10-CM

## 2019-05-02 DIAGNOSIS — N3001 Acute cystitis with hematuria: Secondary | ICD-10-CM

## 2019-05-02 LAB — POCT URINALYSIS DIPSTICK
Glucose, UA: NEGATIVE
Protein, UA: NEGATIVE
Spec Grav, UA: 1.015 (ref 1.010–1.025)
Urobilinogen, UA: 0.2 E.U./dL
pH, UA: 6 (ref 5.0–8.0)

## 2019-05-02 MED ORDER — NITROFURANTOIN MONOHYD MACRO 100 MG PO CAPS: 100.0000 mg | ORAL_CAPSULE | Freq: Two times a day (BID) | ORAL | 0 refills | Status: AC

## 2019-05-02 NOTE — Progress Notes (Signed)
Virtual Visit via Telephone Note  I connected with Gabriella Farley on 05/02/19 at  3:30 PM EST by telephone and verified that I am speaking with the correct person using two identifiers.   I discussed the limitations, risks, security and privacy concerns of performing an evaluation and management service by telephone and the availability of in person appointments. I also discussed with the patient that there may be a patient responsible charge related to this service. The patient expressed understanding and agreed to proceed.  Location patient: home Location provider: work or home office Participants present for the call: patient, provider Patient did not have a visit in the prior 7 days to address this/these issue(s).   History of Present Illness: Pt is a 73 yo female with pmh sig for hypothyroidism, migraines, DM II, DJD, HLD, anxiety followed by Dr. Sarajane Jews.  Seen for acute concern.  Pt with urinary frequency and dysuria that started Wednesday.  Also endorses incomplete bladder emptying.  Trying to increase po intake of water. Denies back pain, n/v, fever, chills, suprapubic pain.     Observations/Objective: Patient sounds cheerful and well on the phone. I do not appreciate any SOB. Speech and thought processing are grossly intact. Patient reported vitals:  Assessment and Plan: Acute cystitis with hematuria  -UA with 3+ leuks, 1+ RBCs, SG 1.015 -will send for UCx -pt allergic to PCN-SOB, and sulfa-hives -will change abx if needed based on Cx results. -encouraged to increase fluid intake -given precautions - Plan: nitrofurantoin, macrocrystal-monohydrate, (MACROBID) 100 MG capsule   Follow Up Instructions: F/u with pcp prn  [id number] 5-10 [id number] 11-20 9443 21-30 I did not refer this patient for an OV in the next 24 hours for this/these issue(s).  I discussed the assessment and treatment plan with the patient. The patient was provided an opportunity to ask questions and all were  answered. The patient agreed with the plan and demonstrated an understanding of the instructions.   The patient was advised to call back or seek an in-person evaluation if the symptoms worsen or if the condition fails to improve as anticipated.  I provided 8 minutes of non-face-to-face time during this encounter.   Billie Ruddy, MD

## 2019-05-03 LAB — URINE CULTURE
MICRO NUMBER:: 1189646
Result:: NO GROWTH
SPECIMEN QUALITY:: ADEQUATE

## 2019-05-07 ENCOUNTER — Ambulatory Visit: Payer: Self-pay

## 2019-05-07 NOTE — Telephone Encounter (Signed)
Provided lab results of 05/05/19 of Dr.  Delma Freeze.  Patient voiced understanding . States she does feel better.

## 2019-05-21 DIAGNOSIS — H5213 Myopia, bilateral: Secondary | ICD-10-CM | POA: Diagnosis not present

## 2019-05-21 DIAGNOSIS — E119 Type 2 diabetes mellitus without complications: Secondary | ICD-10-CM | POA: Diagnosis not present

## 2019-05-21 DIAGNOSIS — H43813 Vitreous degeneration, bilateral: Secondary | ICD-10-CM | POA: Diagnosis not present

## 2019-05-21 DIAGNOSIS — H2513 Age-related nuclear cataract, bilateral: Secondary | ICD-10-CM | POA: Diagnosis not present

## 2019-05-21 LAB — HM DIABETES EYE EXAM

## 2019-07-11 ENCOUNTER — Ambulatory Visit: Payer: Medicare HMO

## 2019-07-13 ENCOUNTER — Ambulatory Visit: Payer: Medicare HMO | Attending: Internal Medicine

## 2019-07-13 DIAGNOSIS — Z23 Encounter for immunization: Secondary | ICD-10-CM | POA: Insufficient documentation

## 2019-07-13 NOTE — Progress Notes (Signed)
   Covid-19 Vaccination Clinic  Name:  Gabriella Farley    MRN: [id number] DOB: January 28, 1946  07/13/2019  [name] was observed post Covid-19 immunization for 30 minutes based on pre-vaccination screening without incidence. She was provided with Vaccine Information Sheet and instruction to access the V-Safe system.   [name] was instructed to call 911 with any severe reactions post vaccine: Marland Kitchen Difficulty breathing  . Swelling of your face and throat  . A fast heartbeat  . A bad rash all over your body  . Dizziness and weakness    Immunizations Administered    Name Date Dose VIS Date Route   Pfizer COVID-19 Vaccine 07/13/2019  9:20 AM 0.3 mL 05/02/2019 Intramuscular   Manufacturer: [place]   Lot: [id number]   [place]: [id number]

## 2019-08-01 DIAGNOSIS — M79672 Pain in left foot: Secondary | ICD-10-CM | POA: Diagnosis not present

## 2019-08-01 DIAGNOSIS — M722 Plantar fascial fibromatosis: Secondary | ICD-10-CM | POA: Diagnosis not present

## 2019-08-06 ENCOUNTER — Ambulatory Visit: Payer: Medicare HMO | Attending: Internal Medicine

## 2019-08-06 DIAGNOSIS — Z23 Encounter for immunization: Secondary | ICD-10-CM

## 2019-08-06 NOTE — Progress Notes (Signed)
   Covid-19 Vaccination Clinic  Name:  Gabriella Farley    MRN: [id number] DOB: [birth year]  08/06/2019  Ms. Games was observed post Covid-19 immunization for 30 minutes based on pre-vaccination screening without incident. She was provided with Vaccine Information Sheet and instruction to access the V-Safe system.   [name] was instructed to call 911 with any severe reactions post vaccine: Marland Kitchen Difficulty breathing  . Swelling of face and throat  . A fast heartbeat  . A bad rash all over body  . Dizziness and weakness   Immunizations Administered    Name Date Dose VIS Date Route   Pfizer COVID-19 Vaccine 08/06/2019  2:03 PM 0.3 mL 05/02/2019 Intramuscular   Manufacturer: [place]   Lot: [id number]   [place]: [id number]

## 2019-08-07 DIAGNOSIS — R69 Illness, unspecified: Secondary | ICD-10-CM | POA: Diagnosis not present

## 2019-08-20 DIAGNOSIS — R69 Illness, unspecified: Secondary | ICD-10-CM | POA: Diagnosis not present

## 2019-09-12 DIAGNOSIS — M722 Plantar fascial fibromatosis: Secondary | ICD-10-CM | POA: Diagnosis not present

## 2019-09-30 ENCOUNTER — Telehealth: Payer: Self-pay | Admitting: Family Medicine

## 2019-09-30 NOTE — Chronic Care Management (AMB) (Signed)
  Chronic Care Management   Outreach Note  09/30/2019 Name: Gabriella Farley MRN: [id number] DOB: 01-10-1946  Referred by: Laurey Morale, MD Reason for referral : Chronic Care Management   An unsuccessful telephone outreach was attempted today. The patient was referred to the pharmacist for assistance with care management and care coordination.   Follow Up Plan:   [place]

## 2019-12-30 ENCOUNTER — Ambulatory Visit (INDEPENDENT_AMBULATORY_CARE_PROVIDER_SITE_OTHER): Payer: Medicare HMO | Admitting: Family Medicine

## 2019-12-30 ENCOUNTER — Encounter: Payer: Self-pay | Admitting: Family Medicine

## 2019-12-30 ENCOUNTER — Other Ambulatory Visit: Payer: Self-pay

## 2019-12-30 VITALS — BP 128/60 | HR 65 | Temp 98.3°F | Wt 144.6 lb

## 2019-12-30 DIAGNOSIS — E118 Type 2 diabetes mellitus with unspecified complications: Secondary | ICD-10-CM | POA: Diagnosis not present

## 2019-12-30 DIAGNOSIS — Z0000 Encounter for general adult medical examination without abnormal findings: Secondary | ICD-10-CM | POA: Diagnosis not present

## 2019-12-30 DIAGNOSIS — E039 Hypothyroidism, unspecified: Secondary | ICD-10-CM

## 2019-12-30 MED ORDER — AMLODIPINE BESYLATE 5 MG PO TABS: 5.0000 mg | ORAL_TABLET | Freq: Every day | ORAL | 3 refills | Status: DC

## 2019-12-30 MED ORDER — DICLOFENAC SODIUM 75 MG PO TBEC: 75.0000 mg | DELAYED_RELEASE_TABLET | Freq: Two times a day (BID) | ORAL | 5 refills | Status: DC

## 2019-12-30 MED ORDER — ROSUVASTATIN CALCIUM 10 MG PO TABS: 10.0000 mg | ORAL_TABLET | Freq: Every day | ORAL | 3 refills | Status: DC

## 2019-12-30 MED ORDER — LEVOTHYROXINE SODIUM 75 MCG PO TABS: 75.0000 ug | ORAL_TABLET | Freq: Every day | ORAL | 3 refills | Status: DC

## 2019-12-30 NOTE — Progress Notes (Signed)
   Subjective:    Patient ID: Gabriella Farley, female    DOB: 11-06-1945, 74 y.o.   MRN: [id number]  HPI Here for a well exam. She feels fine.    Review of Systems  Constitutional: Negative.   HENT: Negative.   Eyes: Negative.   Respiratory: Negative.   Cardiovascular: Negative.   Gastrointestinal: Negative.   Genitourinary: Negative for decreased urine volume, difficulty urinating, dyspareunia, dysuria, enuresis, flank pain, frequency, hematuria, pelvic pain and urgency.  Musculoskeletal: Negative.   Skin: Negative.   Neurological: Negative.   Psychiatric/Behavioral: Negative.        Objective:   Physical Exam Constitutional:      General: She is not in acute distress.    Appearance: She is well-developed.  HENT:     Head: Normocephalic and atraumatic.     Right Ear: External ear normal.     Left Ear: External ear normal.     Nose: Nose normal.     Mouth/Throat:     Pharynx: No oropharyngeal exudate.  Eyes:     General: No scleral icterus.    Conjunctiva/sclera: Conjunctivae normal.     Pupils: Pupils are equal, round, and reactive to light.  Neck:     Thyroid: No thyromegaly.     Vascular: No JVD.  Cardiovascular:     Rate and Rhythm: Normal rate and regular rhythm.     Heart sounds: Normal heart sounds. No murmur heard.  No friction rub. No gallop.   Pulmonary:     Effort: Pulmonary effort is normal. No respiratory distress.     Breath sounds: Normal breath sounds. No wheezing or rales.  Chest:     Chest wall: No tenderness.  Abdominal:     General: Bowel sounds are normal. There is no distension.     Palpations: Abdomen is soft. There is no mass.     Tenderness: There is no abdominal tenderness. There is no guarding or rebound.  Musculoskeletal:        General: No tenderness. Normal range of motion.     Cervical back: Normal range of motion and neck supple.  Lymphadenopathy:     Cervical: No cervical adenopathy.  Skin:    General: Skin is warm and dry.       Findings: No erythema or rash.  Neurological:     Mental Status: She is alert and oriented to person, place, and time.     Cranial Nerves: No cranial nerve deficit.     Motor: No abnormal muscle tone.     Coordination: Coordination normal.     Deep Tendon Reflexes: Reflexes are normal and symmetric. Reflexes normal.  Psychiatric:        Behavior: Behavior normal.        Thought Content: Thought content normal.        Judgment: Judgment normal.           Assessment & Plan:  Well exam./ we discussed diet and exercise. Get fasting labs.  Alysia Penna, MD

## 2019-12-31 LAB — CBC WITH DIFFERENTIAL/PLATELET
Absolute Monocytes: 530 cells/uL (ref 200–950)
Basophils Absolute: 82 cells/uL (ref 0–200)
Basophils Relative: 1.2 %
Eosinophils Absolute: 122 cells/uL (ref 15–500)
Eosinophils Relative: 1.8 %
HCT: 42.6 % (ref 35.0–45.0)
Hemoglobin: 13.6 g/dL (ref 11.7–15.5)
Lymphs Abs: 2271 cells/uL (ref 850–3900)
MCH: 27.9 pg (ref 27.0–33.0)
MCHC: 31.9 g/dL — ABNORMAL LOW (ref 32.0–36.0)
MCV: 87.3 fL (ref 80.0–100.0)
MPV: 11.6 fL (ref 7.5–12.5)
Monocytes Relative: 7.8 %
Neutro Abs: 3794 cells/uL (ref 1500–7800)
Neutrophils Relative %: 55.8 %
Platelets: 231 10*3/uL (ref 140–400)
RBC: 4.88 10*6/uL (ref 3.80–5.10)
RDW: 13.4 % (ref 11.0–15.0)
Total Lymphocyte: 33.4 %
WBC: 6.8 10*3/uL (ref 3.8–10.8)

## 2019-12-31 LAB — HEPATIC FUNCTION PANEL
AG Ratio: 1.5 (calc) (ref 1.0–2.5)
ALT: 10 U/L (ref 6–29)
AST: 15 U/L (ref 10–35)
Albumin: 4.3 g/dL (ref 3.6–5.1)
Alkaline phosphatase (APISO): 71 U/L (ref 37–153)
Bilirubin, Direct: 0.2 mg/dL (ref 0.0–0.2)
Globulin: 2.9 g/dL (calc) (ref 1.9–3.7)
Indirect Bilirubin: 0.5 mg/dL (calc) (ref 0.2–1.2)
Total Bilirubin: 0.7 mg/dL (ref 0.2–1.2)
Total Protein: 7.2 g/dL (ref 6.1–8.1)

## 2019-12-31 LAB — BASIC METABOLIC PANEL
BUN: 11 mg/dL (ref 7–25)
CO2: 28 mmol/L (ref 20–32)
Calcium: 8.8 mg/dL (ref 8.6–10.4)
Chloride: 101 mmol/L (ref 98–110)
Creat: 0.89 mg/dL (ref 0.60–0.93)
Glucose, Bld: 123 mg/dL — ABNORMAL HIGH (ref 65–99)
Potassium: 4.5 mmol/L (ref 3.5–5.3)
Sodium: 138 mmol/L (ref 135–146)

## 2019-12-31 LAB — HEMOGLOBIN A1C
Hgb A1c MFr Bld: 7.2 % of total Hgb — ABNORMAL HIGH (ref <5.7)
Mean Plasma Glucose: 160 (calc)
eAG (mmol/L): 8.9 (calc)

## 2019-12-31 LAB — LIPID PANEL
Cholesterol: 143 mg/dL (ref <200)
HDL: 55 mg/dL (ref >=50)
LDL Cholesterol (Calc): 65 mg/dL (calc)
Non-HDL Cholesterol (Calc): 88 mg/dL (calc) (ref <130)
Total CHOL/HDL Ratio: 2.6 (calc) (ref <5.0)
Triglycerides: 151 mg/dL — ABNORMAL HIGH (ref <150)

## 2019-12-31 LAB — T3, FREE: T3, Free: 2.8 pg/mL (ref 2.3–4.2)

## 2019-12-31 LAB — T4, FREE: Free T4: 1.4 ng/dL (ref 0.8–1.8)

## 2019-12-31 LAB — TSH: TSH: 2.76 mIU/L (ref 0.40–4.50)

## 2020-01-08 DIAGNOSIS — L821 Other seborrheic keratosis: Secondary | ICD-10-CM | POA: Diagnosis not present

## 2020-01-08 DIAGNOSIS — D485 Neoplasm of uncertain behavior of skin: Secondary | ICD-10-CM | POA: Diagnosis not present

## 2020-01-08 DIAGNOSIS — L820 Inflamed seborrheic keratosis: Secondary | ICD-10-CM | POA: Diagnosis not present

## 2020-01-08 DIAGNOSIS — D1801 Hemangioma of skin and subcutaneous tissue: Secondary | ICD-10-CM | POA: Diagnosis not present

## 2020-01-08 DIAGNOSIS — C44319 Basal cell carcinoma of skin of other parts of face: Secondary | ICD-10-CM | POA: Diagnosis not present

## 2020-01-08 DIAGNOSIS — D2262 Melanocytic nevi of left upper limb, including shoulder: Secondary | ICD-10-CM | POA: Diagnosis not present

## 2020-01-20 DIAGNOSIS — C441121 Basal cell carcinoma of skin of right upper eyelid, including canthus: Secondary | ICD-10-CM | POA: Diagnosis not present

## 2020-01-20 DIAGNOSIS — Z85828 Personal history of other malignant neoplasm of skin: Secondary | ICD-10-CM | POA: Diagnosis not present

## 2020-02-17 ENCOUNTER — Ambulatory Visit: Payer: Medicare HMO

## 2020-03-09 ENCOUNTER — Other Ambulatory Visit: Payer: Self-pay | Admitting: Family Medicine

## 2020-03-09 DIAGNOSIS — Z1231 Encounter for screening mammogram for malignant neoplasm of breast: Secondary | ICD-10-CM

## 2020-03-11 ENCOUNTER — Other Ambulatory Visit: Payer: Self-pay

## 2020-03-11 ENCOUNTER — Ambulatory Visit
Admission: RE | Admit: 2020-03-11 | Discharge: 2020-03-11 | Disposition: A | Discharge status: Home / Self Care | Payer: Medicare HMO | Source: Ambulatory Visit | Attending: Family Medicine | Admitting: Family Medicine

## 2020-03-11 DIAGNOSIS — Z1231 Encounter for screening mammogram for malignant neoplasm of breast: Secondary | ICD-10-CM | POA: Diagnosis not present

## 2020-05-20 DIAGNOSIS — H5213 Myopia, bilateral: Secondary | ICD-10-CM | POA: Diagnosis not present

## 2020-05-20 DIAGNOSIS — H2513 Age-related nuclear cataract, bilateral: Secondary | ICD-10-CM | POA: Diagnosis not present

## 2020-05-20 DIAGNOSIS — E119 Type 2 diabetes mellitus without complications: Secondary | ICD-10-CM | POA: Diagnosis not present

## 2020-05-20 DIAGNOSIS — H43813 Vitreous degeneration, bilateral: Secondary | ICD-10-CM | POA: Diagnosis not present

## 2020-05-20 LAB — HM DIABETES EYE EXAM

## 2020-05-31 DIAGNOSIS — H524 Presbyopia: Secondary | ICD-10-CM | POA: Diagnosis not present

## 2020-05-31 DIAGNOSIS — H52223 Regular astigmatism, bilateral: Secondary | ICD-10-CM | POA: Diagnosis not present

## 2020-07-05 ENCOUNTER — Telehealth: Payer: Self-pay | Admitting: Family Medicine

## 2020-07-05 MED ORDER — GLUCOSE BLOOD VI STRP: 1.0000 | ORAL_STRIP | 1 refills | Status: DC

## 2020-07-05 NOTE — Telephone Encounter (Signed)
PT  need an Rx for one-touch test strips  [facility] [place], [place] [address]  Phone:  [phone] Fax:  [phone]

## 2020-07-05 NOTE — Telephone Encounter (Signed)
RX sent to the pharmacy. Dm/cma  

## 2020-08-19 ENCOUNTER — Other Ambulatory Visit: Payer: Self-pay

## 2020-08-19 ENCOUNTER — Ambulatory Visit: Payer: Medicare HMO | Admitting: Podiatry

## 2020-08-19 ENCOUNTER — Encounter: Payer: Self-pay | Admitting: Podiatry

## 2020-08-19 ENCOUNTER — Other Ambulatory Visit: Payer: Self-pay | Admitting: Podiatry

## 2020-08-19 ENCOUNTER — Ambulatory Visit (INDEPENDENT_AMBULATORY_CARE_PROVIDER_SITE_OTHER): Payer: Medicare HMO

## 2020-08-19 DIAGNOSIS — M722 Plantar fascial fibromatosis: Secondary | ICD-10-CM

## 2020-08-19 DIAGNOSIS — S90852A Superficial foreign body, left foot, initial encounter: Secondary | ICD-10-CM

## 2020-08-19 MED ORDER — TRIAMCINOLONE ACETONIDE 40 MG/ML IJ SUSP
20.0000 mg | Freq: Once | INTRAMUSCULAR | Status: AC
  Administered 2020-08-19: 20 mg

## 2020-08-19 MED ORDER — MELOXICAM 15 MG PO TABS: 15.0000 mg | ORAL_TABLET | Freq: Every day | ORAL | 3 refills | Status: DC

## 2020-08-19 NOTE — Progress Notes (Signed)
Subjective:  Patient ID: Gabriella Farley, female    DOB: [birth year],  MRN: [id number] HPI Chief Complaint  Patient presents with  . Foot Pain    Plantar heel left - stepped on a nail in 2020, heel been tender intermittently since, saw PCP-said was PF, saw Dr. Lucia Gaskins xrayed-said no foreign body, but thought she had PF as well, injected April 2021-helped for awhile, recommended an ultrasound, has iced but no help  . New Patient (Initial Visit)    75 y.o. female presents with the above complaint.   ROS: Denies fever chills nausea vomiting muscle aches pains calf pain back pain chest pain shortness of breath.  Past Medical History:  Diagnosis Date  . Allergy    slight allergy to pollen  . Anxiety    no per pt  . Cataract   . Diabetes mellitus without complication ([place])    type 2- "borderline" per pt- no medications needed  . DJD (degenerative joint disease)   . Hyperlipemia   . Hypertension   . Hypothyroidism   . Migraines    history of one several years ago  . Visual floaters    retinal   sees Dr Ishmael Holter   Past Surgical History:  Procedure Laterality Date  . childbirth      x 2  . COLONOSCOPY  10-03-13   per Dr. Deatra Ina, clear, repeat in 7 yrs   . TONSILLECTOMY    . TUBAL LIGATION      Current Outpatient Medications:  .  meloxicam (MOBIC) 15 MG tablet, Take 1 tablet (15 mg total) by mouth daily., Disp: 30 tablet, Rfl: 3 .  amLODipine (NORVASC) 5 MG tablet, Take 1 tablet (5 mg total) by mouth daily., Disp: 90 tablet, Rfl: 3 .  glucose blood test strip, 1 each by Other route 1 day or 1 dose. One Touch test strips: Use as instructed, Disp: 50 each, Rfl: 1 .  levothyroxine (SYNTHROID) 75 MCG tablet, Take 1 tablet (75 mcg total) by mouth daily., Disp: 90 tablet, Rfl: 3 .  rosuvastatin (CRESTOR) 10 MG tablet, Take 1 tablet (10 mg total) by mouth daily., Disp: 90 tablet, Rfl: 3  Allergies  Allergen Reactions  . Penicillins Shortness Of Breath  . Codeine     Sick to stomach   . Sulfonamide Derivatives     welts   Review of Systems Objective:  There were no vitals filed for this visit.  General: Well developed, nourished, in no acute distress, alert and oriented x3   Dermatological: Skin is warm, dry and supple bilateral. Nails x 10 are well maintained; remaining integument appears unremarkable at this time. There are no open sores, no preulcerative lesions, no rash or signs of infection present.  Vascular: Dorsalis Pedis artery and Posterior Tibial artery pedal pulses are 2/4 bilateral with immedate capillary fill time. Pedal hair growth present. No varicosities and no lower extremity edema present bilateral.   Neruologic: Grossly intact via light touch bilateral. Vibratory intact via tuning fork bilateral. Protective threshold with Semmes Wienstein monofilament intact to all pedal sites bilateral. Patellar and Achilles deep tendon reflexes 2+ bilateral. No Babinski or clonus noted bilateral.   Musculoskeletal: No gross boney pedal deformities bilateral. No pain, crepitus, or limitation noted with foot and ankle range of motion bilateral. Muscular strength 5/5 in all groups tested bilateral.  She has a palpable rolling mass in the area of the atrophied fat pad which is consistent with a bursitis or bursa.  This does not appear to  be any type of foreign body.  She does have pain on palpation medial calcaneal tubercle.  No pain on medial lateral compression of calcaneus.  Gait: Unassisted, Nonantalgic.    Radiographs:  Radiographs taken today do not demonstrate any type of foreign body.  They do demonstrate however soft tissue increase in density plantar fascial cannula surgical site with minimal spur formation.  There are no acute findings otherwise.  Assessment & Plan:   Assessment: Planter fasciitis left foot possible bursitis more proximally.  Plan: I injected her left heel today 20 mg Kenalog 5 mg Marcaine point of maximal tenderness.  Placed her in a  plantar fascial brace and started her on meloxicam 15 mg 1 p.o. daily discussed appropriate shoe gear stretching exercise ice therapy and shoe gear modifications.     Andreina Outten T. [place], [place]

## 2020-10-12 ENCOUNTER — Telehealth: Payer: Self-pay | Admitting: *Deleted

## 2020-10-12 ENCOUNTER — Other Ambulatory Visit: Payer: Self-pay | Admitting: *Deleted

## 2020-10-12 DIAGNOSIS — M722 Plantar fascial fibromatosis: Secondary | ICD-10-CM

## 2020-10-12 NOTE — Telephone Encounter (Signed)
Send for ultrasound of the heel.  Looking for foreign body or plantar fasciitis. She will not take my work for it that there is no FB.

## 2020-10-12 NOTE — Telephone Encounter (Signed)
Patient calling to request an ultrasound before next visit,injection or the brace is helping with her pain. Please advise.

## 2020-10-12 NOTE — Telephone Encounter (Signed)
Faxed to [facility] for [place] lt lower extrem.LTD soft tissue,non vasc.10/12/20

## 2020-10-14 NOTE — Telephone Encounter (Signed)
Patient is calling for the status of her ultrasound, explained that the order was sent 10/12/20,they will call her for an appointment,verbalized understanding.

## 2020-10-15 ENCOUNTER — Telehealth: Payer: Self-pay | Admitting: *Deleted

## 2020-10-15 ENCOUNTER — Telehealth: Payer: Self-pay | Admitting: Podiatry

## 2020-10-15 NOTE — Telephone Encounter (Signed)
Called patient and informed that an appointment for ultrasound has been scheduled for her @[facility].10/19/20-1:00, patient was given information, address and phone number.

## 2020-10-15 NOTE — Telephone Encounter (Signed)
Patient stated that she could not get in to get her ultrasound at [facility] imaging this month and that she would need to be referred to another imaging office. Patient would like to get in asap with another imaging office. Please advisr

## 2020-10-19 ENCOUNTER — Other Ambulatory Visit: Payer: Self-pay

## 2020-10-19 ENCOUNTER — Ambulatory Visit (HOSPITAL_BASED_OUTPATIENT_CLINIC_OR_DEPARTMENT_OTHER)
Admission: RE | Admit: 2020-10-19 | Discharge: 2020-10-19 | Disposition: A | Discharge status: Home / Self Care | Payer: Medicare HMO | Source: Ambulatory Visit | Attending: Podiatry | Admitting: Podiatry

## 2020-10-19 DIAGNOSIS — M79606 Pain in leg, unspecified: Secondary | ICD-10-CM | POA: Diagnosis not present

## 2020-10-19 DIAGNOSIS — M722 Plantar fascial fibromatosis: Secondary | ICD-10-CM | POA: Insufficient documentation

## 2020-10-26 ENCOUNTER — Other Ambulatory Visit: Payer: Self-pay

## 2020-10-26 ENCOUNTER — Ambulatory Visit: Payer: Medicare HMO | Admitting: Podiatry

## 2020-10-26 DIAGNOSIS — M722 Plantar fascial fibromatosis: Secondary | ICD-10-CM

## 2020-10-26 MED ORDER — TRIAMCINOLONE ACETONIDE 40 MG/ML IJ SUSP
20.0000 mg | Freq: Once | INTRAMUSCULAR | Status: AC
  Administered 2020-10-26: 20 mg

## 2020-10-26 NOTE — Progress Notes (Signed)
She presents today for follow-up of her Planter fasciitis of her left foot.  She had requested an ultrasound because she thought she had a foreign body in her foot.  She states that her foot was doing some better but the shot that I provided her last time did not help at all.  Objective: Vital signs are stable she is alert and oriented x3.  Pulses are palpable.  She has a palpable rolling mass beneath a calcaneal tubercle with fat atrophy.  Most likely this is a bursitis or a neuritis.  Ultrasound was negative for foreign body and deeper structure.  Assessment: Neuritis bursitis calcaneus plantar left.  Plan: At this point I injected 10 mg of Kenalog directly into the bursitis as I held it in place with my thumb.  She tolerated this well without complications and we will see if this will help alleviate her symptoms.

## 2020-11-18 ENCOUNTER — Telehealth: Payer: Self-pay | Admitting: Family Medicine

## 2020-11-18 NOTE — Telephone Encounter (Signed)
Left message for patient to call back and schedule Medicare Annual Wellness Visit (AWV) either virtually or in office.   awvi 02/19/14 per [facility] please schedule at anytime with [facility] Nurse Health Advisor 1 or 2   This should be a 45 minute visit.

## 2020-11-18 NOTE — Telephone Encounter (Signed)
Patient returned Robin's call regarding an AWV. Patient says that she is not interested at this time.

## 2020-12-14 ENCOUNTER — Ambulatory Visit: Payer: Medicare HMO | Admitting: Podiatry

## 2020-12-30 ENCOUNTER — Encounter: Payer: Self-pay | Admitting: Family Medicine

## 2020-12-30 ENCOUNTER — Ambulatory Visit (INDEPENDENT_AMBULATORY_CARE_PROVIDER_SITE_OTHER): Payer: Medicare HMO | Admitting: Family Medicine

## 2020-12-30 ENCOUNTER — Other Ambulatory Visit: Payer: Self-pay

## 2020-12-30 VITALS — BP 124/60 | HR 66 | Temp 97.8°F | Ht 65.0 in | Wt 144.0 lb

## 2020-12-30 DIAGNOSIS — M810 Age-related osteoporosis without current pathological fracture: Secondary | ICD-10-CM | POA: Diagnosis not present

## 2020-12-30 DIAGNOSIS — Z0000 Encounter for general adult medical examination without abnormal findings: Secondary | ICD-10-CM | POA: Diagnosis not present

## 2020-12-30 DIAGNOSIS — E785 Hyperlipidemia, unspecified: Secondary | ICD-10-CM

## 2020-12-30 DIAGNOSIS — E119 Type 2 diabetes mellitus without complications: Secondary | ICD-10-CM

## 2020-12-30 DIAGNOSIS — E039 Hypothyroidism, unspecified: Secondary | ICD-10-CM

## 2020-12-30 LAB — CBC WITH DIFFERENTIAL/PLATELET
Basophils Absolute: 0.1 10*3/uL (ref 0.0–0.1)
Basophils Relative: 1 % (ref 0.0–3.0)
Eosinophils Absolute: 0.1 10*3/uL (ref 0.0–0.7)
Eosinophils Relative: 1.9 % (ref 0.0–5.0)
HCT: 40.9 % (ref 36.0–46.0)
Hemoglobin: 13.5 g/dL (ref 12.0–15.0)
Lymphocytes Relative: 35.5 % (ref 12.0–46.0)
Lymphs Abs: 2.4 10*3/uL (ref 0.7–4.0)
MCHC: 33 g/dL (ref 30.0–36.0)
MCV: 85.5 fl (ref 78.0–100.0)
Monocytes Absolute: 0.5 10*3/uL (ref 0.1–1.0)
Monocytes Relative: 7.7 % (ref 3.0–12.0)
Neutro Abs: 3.6 10*3/uL (ref 1.4–7.7)
Neutrophils Relative %: 53.9 % (ref 43.0–77.0)
Platelets: 221 10*3/uL (ref 150.0–400.0)
RBC: 4.79 Mil/uL (ref 3.87–5.11)
RDW: 14 % (ref 11.5–15.5)
WBC: 6.7 10*3/uL (ref 4.0–10.5)

## 2020-12-30 LAB — LIPID PANEL
Cholesterol: 144 mg/dL (ref 0–200)
HDL: 57.9 mg/dL (ref >39.00)
LDL Cholesterol: 60 mg/dL (ref 0–99)
NonHDL: 85.84
Total CHOL/HDL Ratio: 2
Triglycerides: 127 mg/dL (ref 0.0–149.0)
VLDL: 25.4 mg/dL (ref 0.0–40.0)

## 2020-12-30 LAB — HEPATIC FUNCTION PANEL
ALT: 10 U/L (ref 0–35)
AST: 15 U/L (ref 0–37)
Albumin: 4.2 g/dL (ref 3.5–5.2)
Alkaline Phosphatase: 76 U/L (ref 39–117)
Bilirubin, Direct: 0.2 mg/dL (ref 0.0–0.3)
Total Bilirubin: 0.8 mg/dL (ref 0.2–1.2)
Total Protein: 7.2 g/dL (ref 6.0–8.3)

## 2020-12-30 LAB — BASIC METABOLIC PANEL
BUN: 11 mg/dL (ref 6–23)
CO2: 28 mEq/L (ref 19–32)
Calcium: 8.9 mg/dL (ref 8.4–10.5)
Chloride: 102 mEq/L (ref 96–112)
Creatinine, Ser: 0.81 mg/dL (ref 0.40–1.20)
GFR: 72.3 mL/min (ref >60.00)
Glucose, Bld: 129 mg/dL — ABNORMAL HIGH (ref 70–99)
Potassium: 4.3 mEq/L (ref 3.5–5.1)
Sodium: 139 mEq/L (ref 135–145)

## 2020-12-30 LAB — MICROALBUMIN / CREATININE URINE RATIO
Creatinine,U: 26.3 mg/dL
Microalb Creat Ratio: 2.7 mg/g (ref 0.0–30.0)
Microalb, Ur: <0.7 mg/dL (ref 0.0–1.9)

## 2020-12-30 LAB — T4, FREE: Free T4: 1.16 ng/dL (ref 0.60–1.60)

## 2020-12-30 LAB — HEMOGLOBIN A1C: Hgb A1c MFr Bld: 7.4 % — ABNORMAL HIGH (ref 4.6–6.5)

## 2020-12-30 LAB — T3, FREE: T3, Free: 3.2 pg/mL (ref 2.3–4.2)

## 2020-12-30 LAB — TSH: TSH: 1.13 u[IU]/mL (ref 0.35–5.50)

## 2020-12-30 MED ORDER — AMLODIPINE BESYLATE 5 MG PO TABS: 5.0000 mg | ORAL_TABLET | Freq: Every day | ORAL | 3 refills | Status: DC

## 2020-12-30 MED ORDER — ROSUVASTATIN CALCIUM 10 MG PO TABS: 10.0000 mg | ORAL_TABLET | Freq: Every day | ORAL | 3 refills | Status: DC

## 2020-12-30 MED ORDER — LEVOTHYROXINE SODIUM 75 MCG PO TABS: 75.0000 ug | ORAL_TABLET | Freq: Every day | ORAL | 3 refills | Status: DC

## 2020-12-30 NOTE — Progress Notes (Addendum)
   Subjective:    Patient ID: Gabriella Farley, female    DOB: Oct 19, 1945, 75 y.o.   MRN: [id number]  HPI Here for a well exam. She feels fine. Her BP has been stable. Her anxiety and migraines are well controlled.    Review of Systems  Constitutional: Negative.   HENT: Negative.    Eyes: Negative.   Respiratory: Negative.    Cardiovascular: Negative.   Gastrointestinal: Negative.   Genitourinary:  Negative for decreased urine volume, difficulty urinating, dyspareunia, dysuria, enuresis, flank pain, frequency, hematuria, pelvic pain and urgency.  Musculoskeletal: Negative.   Skin: Negative.   Neurological: Negative.  Negative for headaches.  Psychiatric/Behavioral: Negative.        Objective:   Physical Exam Constitutional:      General: She is not in acute distress.    Appearance: She is well-developed.  HENT:     Head: Normocephalic and atraumatic.     Right Ear: External ear normal.     Left Ear: External ear normal.     Nose: Nose normal.     Mouth/Throat:     Pharynx: No oropharyngeal exudate.  Eyes:     General: No scleral icterus.    Conjunctiva/sclera: Conjunctivae normal.     Pupils: Pupils are equal, round, and reactive to light.  Neck:     Thyroid: No thyromegaly.     Vascular: No JVD.  Cardiovascular:     Rate and Rhythm: Normal rate and regular rhythm.     Heart sounds: Normal heart sounds. No murmur heard.   No friction rub. No gallop.  Pulmonary:     Effort: Pulmonary effort is normal. No respiratory distress.     Breath sounds: Normal breath sounds. No wheezing or rales.  Chest:     Chest wall: No tenderness.  Abdominal:     General: Bowel sounds are normal. There is no distension.     Palpations: Abdomen is soft. There is no mass.     Tenderness: There is no abdominal tenderness. There is no guarding or rebound.  Musculoskeletal:        General: No tenderness. Normal range of motion.     Cervical back: Normal range of motion and neck supple.   Lymphadenopathy:     Cervical: No cervical adenopathy.  Skin:    General: Skin is warm and dry.     Findings: No erythema or rash.  Neurological:     Mental Status: She is alert and oriented to person, place, and time.     Cranial Nerves: No cranial nerve deficit.     Motor: No abnormal muscle tone.     Coordination: Coordination normal.     Deep Tendon Reflexes: Reflexes are normal and symmetric. Reflexes normal.  Psychiatric:        Behavior: Behavior normal.        Thought Content: Thought content normal.        Judgment: Judgment normal.          Assessment & Plan:  Well exam.  We discussed diet and exercise. Get fasting labs to check lipids for hyperlipemia, A1c for diabetes, and thyroid levels for hypothyroidism.  Set up another colonoscopy.  Alysia Penna, MD

## 2020-12-30 NOTE — Addendum Note (Signed)
Addended by: Amanda Cockayne on: 12/30/2020 11:25 AM   Modules accepted: Orders

## 2020-12-30 NOTE — Addendum Note (Signed)
Addended by: Wyvonne Lenz on: 12/30/2020 11:08 AM   Modules accepted: Orders

## 2020-12-31 MED ORDER — METFORMIN HCL 500 MG PO TABS: 500.0000 mg | ORAL_TABLET | Freq: Two times a day (BID) | ORAL | 3 refills | Status: DC

## 2020-12-31 NOTE — Addendum Note (Signed)
Addended by: Nilda Riggs on: 12/31/2020 02:48 PM   Modules accepted: Orders

## 2020-12-31 NOTE — Addendum Note (Signed)
Addended by: Nilda Riggs on: 12/31/2020 08:46 AM   Modules accepted: Orders

## 2021-01-10 ENCOUNTER — Encounter: Payer: Self-pay | Admitting: Family Medicine

## 2021-01-11 DIAGNOSIS — L570 Actinic keratosis: Secondary | ICD-10-CM | POA: Diagnosis not present

## 2021-01-11 DIAGNOSIS — L820 Inflamed seborrheic keratosis: Secondary | ICD-10-CM | POA: Diagnosis not present

## 2021-01-11 DIAGNOSIS — D485 Neoplasm of uncertain behavior of skin: Secondary | ICD-10-CM | POA: Diagnosis not present

## 2021-01-11 DIAGNOSIS — L821 Other seborrheic keratosis: Secondary | ICD-10-CM | POA: Diagnosis not present

## 2021-01-11 DIAGNOSIS — Z85828 Personal history of other malignant neoplasm of skin: Secondary | ICD-10-CM | POA: Diagnosis not present

## 2021-01-11 DIAGNOSIS — D2262 Melanocytic nevi of left upper limb, including shoulder: Secondary | ICD-10-CM | POA: Diagnosis not present

## 2021-01-11 DIAGNOSIS — D1801 Hemangioma of skin and subcutaneous tissue: Secondary | ICD-10-CM | POA: Diagnosis not present

## 2021-01-11 DIAGNOSIS — D225 Melanocytic nevi of trunk: Secondary | ICD-10-CM | POA: Diagnosis not present

## 2021-01-20 DIAGNOSIS — D485 Neoplasm of uncertain behavior of skin: Secondary | ICD-10-CM | POA: Diagnosis not present

## 2021-01-20 DIAGNOSIS — L98499 Non-pressure chronic ulcer of skin of other sites with unspecified severity: Secondary | ICD-10-CM | POA: Diagnosis not present

## 2021-01-31 ENCOUNTER — Encounter: Payer: Self-pay | Admitting: Gastroenterology

## 2021-01-31 DIAGNOSIS — L820 Inflamed seborrheic keratosis: Secondary | ICD-10-CM | POA: Diagnosis not present

## 2021-03-02 ENCOUNTER — Other Ambulatory Visit: Payer: Self-pay | Admitting: Family Medicine

## 2021-03-02 DIAGNOSIS — Z1231 Encounter for screening mammogram for malignant neoplasm of breast: Secondary | ICD-10-CM

## 2021-04-04 ENCOUNTER — Other Ambulatory Visit (INDEPENDENT_AMBULATORY_CARE_PROVIDER_SITE_OTHER): Payer: Medicare HMO

## 2021-04-04 DIAGNOSIS — E118 Type 2 diabetes mellitus with unspecified complications: Secondary | ICD-10-CM

## 2021-04-04 LAB — HEMOGLOBIN A1C: Hgb A1c MFr Bld: 7.1 % — ABNORMAL HIGH (ref 4.6–6.5)

## 2021-04-06 ENCOUNTER — Telehealth: Payer: Self-pay | Admitting: Family Medicine

## 2021-04-06 NOTE — Telephone Encounter (Signed)
Spoke with patient about results.   She stated since she started taking Metformin, she has been experiencing bad side effects, such as diarrhea, acid reflux, bloating, gas.   These side effects has severely affect her life.   Please advise

## 2021-04-06 NOTE — Telephone Encounter (Signed)
Pt is calling and would like a1c results and to report the metformin is making her sick

## 2021-04-07 ENCOUNTER — Other Ambulatory Visit: Payer: Self-pay

## 2021-04-07 MED ORDER — GLIPIZIDE 5 MG PO TABS: 5.0000 mg | ORAL_TABLET | Freq: Two times a day (BID) | ORAL | 5 refills | Status: DC

## 2021-04-07 NOTE — Telephone Encounter (Signed)
I understand. Tell her to strop the Metformin. Instead call in Glipizide 5 mg BID, #60 with 5 rf. This should not bother her stomach. Check another A1c in 90 days

## 2021-04-07 NOTE — Telephone Encounter (Signed)
Spoke with pt verbalized understanding of Dr Sarajane Jews advise, pt new Rx for glipizide sent to the pharmacy

## 2021-04-08 ENCOUNTER — Ambulatory Visit
Admission: RE | Admit: 2021-04-08 | Discharge: 2021-04-08 | Disposition: A | Discharge status: Home / Self Care | Payer: Medicare HMO | Source: Ambulatory Visit | Attending: Family Medicine | Admitting: Family Medicine

## 2021-04-08 ENCOUNTER — Other Ambulatory Visit: Payer: Self-pay

## 2021-04-08 DIAGNOSIS — M810 Age-related osteoporosis without current pathological fracture: Secondary | ICD-10-CM

## 2021-04-08 DIAGNOSIS — Z1231 Encounter for screening mammogram for malignant neoplasm of breast: Secondary | ICD-10-CM | POA: Diagnosis not present

## 2021-04-08 DIAGNOSIS — Z780 Asymptomatic menopausal state: Secondary | ICD-10-CM | POA: Diagnosis not present

## 2021-04-08 DIAGNOSIS — M8589 Other specified disorders of bone density and structure, multiple sites: Secondary | ICD-10-CM | POA: Diagnosis not present

## 2021-04-11 ENCOUNTER — Telehealth: Payer: Self-pay

## 2021-04-11 NOTE — Telephone Encounter (Signed)
Patient called asking for results to bone density, patient is aware Dr. Sarajane Jews is out of the office

## 2021-04-12 ENCOUNTER — Other Ambulatory Visit: Payer: Self-pay | Admitting: Family Medicine

## 2021-04-12 DIAGNOSIS — R928 Other abnormal and inconclusive findings on diagnostic imaging of breast: Secondary | ICD-10-CM

## 2021-04-12 NOTE — Telephone Encounter (Signed)
Spoke with pt advised that Dr Sarajane Jews is out of the office and will notify pt of her Dexa scan results when Dr Sarajane Jews reads the report, pt verbalized understanding

## 2021-05-11 ENCOUNTER — Ambulatory Visit
Admission: RE | Admit: 2021-05-11 | Discharge: 2021-05-11 | Disposition: A | Discharge status: Home / Self Care | Payer: Medicare HMO | Source: Ambulatory Visit | Attending: Family Medicine | Admitting: Family Medicine

## 2021-05-11 ENCOUNTER — Ambulatory Visit: Payer: Medicare HMO

## 2021-05-11 DIAGNOSIS — R922 Inconclusive mammogram: Secondary | ICD-10-CM | POA: Diagnosis not present

## 2021-05-11 DIAGNOSIS — R928 Other abnormal and inconclusive findings on diagnostic imaging of breast: Secondary | ICD-10-CM

## 2021-05-12 ENCOUNTER — Telehealth: Payer: Self-pay | Admitting: Family Medicine

## 2021-05-12 NOTE — Telephone Encounter (Signed)
Spoke with patient to schedule Medicare Annual Wellness Visit (AWV) either virtually or in office.   Patient declined  stated she is not interested in doing awv    Due awvi 02/19/14 per [facility]

## 2021-05-13 NOTE — Telephone Encounter (Signed)
Noted. That is her choice.

## 2021-06-01 ENCOUNTER — Telehealth: Payer: Self-pay | Admitting: Family Medicine

## 2021-06-02 NOTE — Telephone Encounter (Signed)
Patient called get refills on diclofenac (VOLTAREN) 75 MG EC tablet [Pharmacy Med Name: DICLOFENAC SOD DR 75 MG TAB] [[id number]]   Patient states that she is still using the medication on occasion for her arthritis.   Please send to  [facility] [id number] [place], [place] - [address] Phone:  [phone]  Fax:  [phone]         Please advise

## 2021-06-02 NOTE — Telephone Encounter (Addendum)
Medication has been discontinued on 12/30/2019.    Patient was last seen physical on 12/20/2020.  No future office visit scheduled.      Please advise if okay for refill.

## 2021-06-03 ENCOUNTER — Other Ambulatory Visit: Payer: Self-pay

## 2021-06-03 MED ORDER — DICLOFENAC SODIUM 75 MG PO TBEC: 75.0000 mg | DELAYED_RELEASE_TABLET | Freq: Two times a day (BID) | ORAL | 5 refills | Status: DC

## 2021-06-03 NOTE — Telephone Encounter (Signed)
Call in Diclofenac 75 mg to take BID as needed for joint pain, #60 with 5 rf

## 2021-09-01 ENCOUNTER — Emergency Department (HOSPITAL_BASED_OUTPATIENT_CLINIC_OR_DEPARTMENT_OTHER): Payer: Medicare HMO

## 2021-09-01 ENCOUNTER — Encounter (HOSPITAL_BASED_OUTPATIENT_CLINIC_OR_DEPARTMENT_OTHER): Payer: Self-pay

## 2021-09-01 ENCOUNTER — Emergency Department (HOSPITAL_BASED_OUTPATIENT_CLINIC_OR_DEPARTMENT_OTHER)
Admission: EM | Admit: 2021-09-01 | Discharge: 2021-09-01 | Disposition: A | Discharge status: Home / Self Care | Payer: Medicare HMO | Attending: Emergency Medicine | Admitting: Emergency Medicine

## 2021-09-01 ENCOUNTER — Other Ambulatory Visit: Payer: Self-pay

## 2021-09-01 DIAGNOSIS — R1012 Left upper quadrant pain: Secondary | ICD-10-CM | POA: Diagnosis not present

## 2021-09-01 DIAGNOSIS — E119 Type 2 diabetes mellitus without complications: Secondary | ICD-10-CM | POA: Diagnosis not present

## 2021-09-01 DIAGNOSIS — Z79899 Other long term (current) drug therapy: Secondary | ICD-10-CM | POA: Diagnosis not present

## 2021-09-01 DIAGNOSIS — Z7984 Long term (current) use of oral hypoglycemic drugs: Secondary | ICD-10-CM | POA: Insufficient documentation

## 2021-09-01 DIAGNOSIS — E039 Hypothyroidism, unspecified: Secondary | ICD-10-CM | POA: Insufficient documentation

## 2021-09-01 DIAGNOSIS — R1032 Left lower quadrant pain: Secondary | ICD-10-CM | POA: Diagnosis not present

## 2021-09-01 DIAGNOSIS — K8020 Calculus of gallbladder without cholecystitis without obstruction: Secondary | ICD-10-CM | POA: Diagnosis not present

## 2021-09-01 DIAGNOSIS — N281 Cyst of kidney, acquired: Secondary | ICD-10-CM | POA: Diagnosis not present

## 2021-09-01 LAB — URINALYSIS, ROUTINE W REFLEX MICROSCOPIC
Bilirubin Urine: NEGATIVE
Glucose, UA: NEGATIVE mg/dL
Hgb urine dipstick: NEGATIVE
Ketones, ur: NEGATIVE mg/dL
Leukocytes,Ua: NEGATIVE
Nitrite: NEGATIVE
Protein, ur: NEGATIVE mg/dL
Specific Gravity, Urine: 1.005 (ref 1.005–1.030)
pH: 5 (ref 5.0–8.0)

## 2021-09-01 LAB — COMPREHENSIVE METABOLIC PANEL
ALT: 9 U/L (ref 0–44)
AST: 15 U/L (ref 15–41)
Albumin: 4.6 g/dL (ref 3.5–5.0)
Alkaline Phosphatase: 74 U/L (ref 38–126)
Anion gap: 9 (ref 5–15)
BUN: 13 mg/dL (ref 8–23)
CO2: 28 mmol/L (ref 22–32)
Calcium: 9.5 mg/dL (ref 8.9–10.3)
Chloride: 103 mmol/L (ref 98–111)
Creatinine, Ser: 0.9 mg/dL (ref 0.44–1.00)
GFR, Estimated: >60 mL/min (ref >60)
Glucose, Bld: 111 mg/dL — ABNORMAL HIGH (ref 70–99)
Potassium: 3.8 mmol/L (ref 3.5–5.1)
Sodium: 140 mmol/L (ref 135–145)
Total Bilirubin: 0.8 mg/dL (ref 0.3–1.2)
Total Protein: 7.9 g/dL (ref 6.5–8.1)

## 2021-09-01 LAB — CBC
HCT: 43.6 % (ref 36.0–46.0)
Hemoglobin: 13.9 g/dL (ref 12.0–15.0)
MCH: 27.1 pg (ref 26.0–34.0)
MCHC: 31.9 g/dL (ref 30.0–36.0)
MCV: 85.2 fL (ref 80.0–100.0)
Platelets: 222 10*3/uL (ref 150–400)
RBC: 5.12 MIL/uL — ABNORMAL HIGH (ref 3.87–5.11)
RDW: 13.9 % (ref 11.5–15.5)
WBC: 7.7 10*3/uL (ref 4.0–10.5)
nRBC: 0 % (ref 0.0–0.2)

## 2021-09-01 LAB — LIPASE, BLOOD: Lipase: 21 U/L (ref 11–51)

## 2021-09-01 MED ORDER — DICYCLOMINE HCL 20 MG PO TABS: 20.0000 mg | ORAL_TABLET | Freq: Two times a day (BID) | ORAL | 0 refills | Status: DC | PRN

## 2021-09-01 MED ORDER — SODIUM CHLORIDE 0.9 % IV BOLUS
1000.0000 mL | Freq: Once | INTRAVENOUS | Status: AC
  Administered 2021-09-01: 1000 mL via INTRAVENOUS

## 2021-09-01 MED ORDER — IOHEXOL 300 MG/ML  SOLN
100.0000 mL | Freq: Once | INTRAMUSCULAR | Status: AC | PRN
  Administered 2021-09-01: 80 mL via INTRAVENOUS

## 2021-09-01 MED ORDER — MORPHINE SULFATE (PF) 4 MG/ML IV SOLN
4.0000 mg | Freq: Once | INTRAVENOUS | Status: AC
  Administered 2021-09-01: 4 mg via INTRAVENOUS
  Filled 2021-09-01: qty 1

## 2021-09-01 NOTE — Discharge Instructions (Addendum)
Your CT scan and labs were unremarkable today.  You may have abdominal cramps so you can take Bentyl as prescribed ? ?Please follow-up with your doctor.  If you have persistent pain consider following up with your GI doctor. ? ?Return to ER if you have worse abdominal pain, rash, fever, vomiting ? ? ?

## 2021-09-01 NOTE — ED Provider Notes (Signed)
?[facility] ?Provider Note ? ? ?CSN: [id number] ?Arrival date & time: 09/01/21  1846 ? ?  ? ?History ? ?Chief Complaint  ?Patient presents with  ? Abdominal Pain  ? ? ?Gabriella Farley is a 76 y.o. female history of diabetes, hypothyroidism here presenting with left flank pain and left upper quadrant pain.  Patient states that she has left upper quadrant pain radiating to the flank for the last 4 days.  Patient has been trying some heat and cold packs with no relief.  Denies any urinary symptoms.  Denies any sick contacts or fever ? ?The history is provided by the patient.  ? ?  ? ?Home Medications ?Prior to Admission medications   ?Medication Sig Start Date End Date Taking? Authorizing Provider  ?amLODipine (NORVASC) 5 MG tablet Take 1 tablet (5 mg total) by mouth daily. 12/30/20   Laurey Morale, MD  ?diclofenac (VOLTAREN) 75 MG EC tablet Take 1 tablet (75 mg total) by mouth 2 (two) times daily. 06/03/21   Laurey Morale, MD  ?glipiZIDE (GLUCOTROL) 5 MG tablet Take 1 tablet (5 mg total) by mouth 2 (two) times daily before a meal. 04/07/21   Laurey Morale, MD  ?glucose blood test strip 1 each by Other route 1 day or 1 dose. One Touch test strips: ?Use as instructed 07/05/20   Laurey Morale, MD  ?levothyroxine (SYNTHROID) 75 MCG tablet Take 1 tablet (75 mcg total) by mouth daily. 12/30/20   Laurey Morale, MD  ?rosuvastatin (CRESTOR) 10 MG tablet Take 1 tablet (10 mg total) by mouth daily. 12/30/20   Laurey Morale, MD  ?   ? ?Allergies    ?Penicillins, Codeine, and Sulfonamide derivatives   ? ?Review of Systems   ?Review of Systems  ?Gastrointestinal:  Positive for abdominal pain.  ?All other systems reviewed and are negative. ? ?Physical Exam ?Updated Vital Signs ?BP (!) 137/54   Pulse 65   Temp 98.2 ?F (36.8 ?C)   Resp (!) 21   Ht '5\' 4"'$  (1.626 m)   Wt 66.2 kg   SpO2 98%   BMI 25.06 kg/m?  ?Physical Exam ?Vitals and nursing note reviewed.  ?Constitutional:   ?   Appearance: She is  well-developed.  ?HENT:  ?   Head: Normocephalic.  ?   Mouth/Throat:  ?   Mouth: Mucous membranes are moist.  ?Eyes:  ?   Extraocular Movements: Extraocular movements intact.  ?Cardiovascular:  ?   Rate and Rhythm: Normal rate and regular rhythm.  ?Abdominal:  ?   General: Abdomen is flat.  ?   Comments: Mild tenderness in the left upper quadrant and left CVA tenderness.  No obvious rash in that area to suggest shingles.  Patient does have some tenderness to the light touch of the skin  ?Skin: ?   General: Skin is warm.  ?   Capillary Refill: Capillary refill takes less than 2 seconds.  ?Neurological:  ?   General: No focal deficit present.  ?   Mental Status: She is alert and oriented to person, place, and time.  ?Psychiatric:     ?   Mood and Affect: Mood normal.     ?   Behavior: Behavior normal.  ? ? ?ED Results / Procedures / Treatments   ?Labs ?(all labs ordered are listed, but only abnormal results are displayed) ?Labs Reviewed  ?COMPREHENSIVE METABOLIC PANEL - Abnormal; Notable for the following components:  ?    Result Value  ?  Glucose, Bld 111 (*)   ? All other components within normal limits  ?CBC - Abnormal; Notable for the following components:  ? RBC 5.12 (*)   ? All other components within normal limits  ?URINALYSIS, ROUTINE W REFLEX MICROSCOPIC - Abnormal; Notable for the following components:  ? Color, Urine COLORLESS (*)   ? All other components within normal limits  ?LIPASE, BLOOD  ? ? ?EKG ?EKG Interpretation ? ?Date/Time:  Thursday September 01 2021 19:34:39 EDT ?Ventricular Rate:  70 ?PR Interval:  160 ?QRS Duration: 70 ?QT Interval:  370 ?QTC Calculation: 399 ?R Axis:   61 ?Text Interpretation: Normal sinus rhythm Normal ECG When compared with ECG of 25-Jul-2012 13:16, ST no longer depressed in Inferior leads Confirmed by Wandra Arthurs [phone]) on 09/01/2021 10:57:38 PM ? ?Radiology ?CT ABDOMEN PELVIS W CONTRAST ? ?Result Date: 09/01/2021 ?CLINICAL DATA:  Left-sided abdominal pain. EXAM: CT ABDOMEN  AND PELVIS WITH CONTRAST TECHNIQUE: Multidetector CT imaging of the abdomen and pelvis was performed using the standard protocol following bolus administration of intravenous contrast. RADIATION DOSE REDUCTION: This exam was performed according to the departmental dose-optimization program which includes automated exposure control, adjustment of the mA and/or kV according to patient size and/or use of iterative reconstruction technique. CONTRAST:  19m OMNIPAQUE IOHEXOL 300 MG/ML  SOLN COMPARISON:  None. FINDINGS: Lower chest: No acute abnormality. Hepatobiliary: No focal liver abnormality is seen. Numerous tiny gallstones are seen within the lumen of a moderately distended gallbladder. There is no evidence of gallbladder wall thickening, pericholecystic inflammation or biliary dilatation. Pancreas: Unremarkable. No pancreatic ductal dilatation or surrounding inflammatory changes. Spleen: Normal in size without focal abnormality. Adrenals/Urinary Tract: Adrenal glands are unremarkable. Kidneys are normal in size, without renal calculi or hydronephrosis. A 6 mm cyst is seen within the posteromedial aspect of the mid right kidney. Additional subcentimeter cyst is seen within the lower pole of the left kidney. No additional follow-up or imaging is recommended. Bladder is unremarkable. Stomach/Bowel: Stomach is within normal limits. The appendix is not clearly identified. No evidence of bowel wall thickening, distention, or inflammatory changes. Vascular/Lymphatic: Aortic atherosclerosis. No enlarged abdominal or pelvic lymph nodes. Reproductive: Uterus and bilateral adnexa are unremarkable. Other: No abdominal wall hernia or abnormality. No abdominopelvic ascites. Musculoskeletal: Degenerative changes are seen within the mid and lower lumbar spine. IMPRESSION: 1. Cholelithiasis. 2. Aortic atherosclerosis. Aortic Atherosclerosis (ICD10-I70.0). Electronically Signed   By: TVirgina NorfolkM.D.   On: 09/01/2021 23:02    ? ?Procedures ?Procedures  ? ? ?Medications Ordered in ED ?Medications  ?morphine (PF) 4 MG/ML injection 4 mg (4 mg Intravenous Given 09/01/21 2213)  ?sodium chloride 0.9 % bolus 1,000 mL (1,000 mLs Intravenous New Bag/Given 09/01/21 2213)  ?iohexol (OMNIPAQUE) 300 MG/ML solution 100 mL (80 mLs Intravenous Contrast Given 09/01/21 2233)  ? ? ?ED Course/ Medical Decision Making/ A&P ?  ?                        ?Medical Decision Making ?NALEISA HOWKis a 76y.o. female here presenting with left upper quadrant and left flank pain.  Consider diverticulitis versus colitis versus renal colic or pyelonephritis. Plan to get CBC and CMP and urinalysis and CT abdomen pelvis. ? ?11:18 PM ?Labs unremarkable and urinalysis normal.  CT abdomen pelvis unremarkable.  I wonder if she has abdominal cramps or she has early shingles.  I do not see any rash however.  We will give Bentyl as needed for  cramps.  We will have her follow-up with her primary care doctor and GI doctor. ? ? ?Problems Addressed: ?Left upper quadrant abdominal pain: acute illness or injury ? ?Amount and/or Complexity of Data Reviewed ?Labs: ordered. Decision-making details documented in ED Course. ?Radiology: ordered and independent interpretation performed. Decision-making details documented in ED Course. ? ?Risk ?Prescription drug management. ? ?Final Clinical Impression(s) / ED Diagnoses ?Final diagnoses:  ?None  ? ? ?Rx / DC Orders ?ED Discharge Orders   ? ? None  ? ?  ? ? ?  ?Drenda Freeze, MD ?09/01/21 2320 ? ?

## 2021-09-01 NOTE — ED Triage Notes (Signed)
Report left abdomen pain that radiates to her back x4 days. She has been using heat/cold therapy.  ?

## 2021-09-01 NOTE — ED Notes (Signed)
Patient transported to CT 

## 2021-09-02 ENCOUNTER — Ambulatory Visit (INDEPENDENT_AMBULATORY_CARE_PROVIDER_SITE_OTHER): Payer: Medicare HMO | Admitting: Family Medicine

## 2021-09-02 VITALS — BP 160/70 | HR 64 | Temp 98.4°F | Ht 64.0 in | Wt 146.6 lb

## 2021-09-02 DIAGNOSIS — R1012 Left upper quadrant pain: Secondary | ICD-10-CM | POA: Diagnosis not present

## 2021-09-02 LAB — POCT URINALYSIS DIPSTICK
Bilirubin, UA: NEGATIVE
Blood, UA: NEGATIVE
Glucose, UA: NEGATIVE
Ketones, UA: NEGATIVE
Leukocytes, UA: NEGATIVE
Nitrite, UA: NEGATIVE
Protein, UA: NEGATIVE
Spec Grav, UA: 1.015 (ref 1.010–1.025)
Urobilinogen, UA: 0.2 E.U./dL
pH, UA: 6 (ref 5.0–8.0)

## 2021-09-02 MED ORDER — TRAMADOL HCL 50 MG PO TABS: 50.0000 mg | ORAL_TABLET | Freq: Three times a day (TID) | ORAL | 0 refills | Status: AC | PRN

## 2021-09-02 NOTE — Progress Notes (Signed)
Subjective:  ? ? Patient ID: Gabriella Farley, female    DOB: 12-Dec-1945, 76 y.o.   MRN: [id number] ? ?Chief Complaint  ?Patient presents with  ? Flank Pain  ?  Was seen in ED last night  ? ? ?HPI ?Patient was seen today for acute concern.  Pt seen in ED last night for LUQ pain that radiates to L back 5 days.  Pain described as 10/10, initially intermittent and dull but now sharp and constant.  Patient also notes chills x3 days and nausea.  Denies diarrhea, constipation.  Tried Gas-X for symptoms.  Labs and imaging in ED including UA and CT abdomen pelvis unremarkable.  Concerned patient may have early shingles or abdominal cramping.  Discharged with Bentyl for cramps.  No rash present since last night.  Colonoscopy done 09/23/2013 normal without polyps or diverticula.  7-year recall advised. ? ?Past Medical History:  ?Diagnosis Date  ? Allergy   ? slight allergy to pollen  ? Anxiety   ? no per pt  ? Cataract   ? DJD (degenerative joint disease)   ? Hyperlipemia   ? Hypertension   ? Hypothyroidism   ? Migraines   ? history of one several years ago  ? Visual floaters   ? retinal   sees Dr Ishmael Holter  ? ? ?Allergies  ?Allergen Reactions  ? Penicillins Shortness Of Breath  ? Codeine   ?  Sick to stomach  ? Sulfonamide Derivatives   ?  welts  ? ? ?ROS ?General: Denies fever, chills, night sweats, changes in weight, changes in appetite +chills ?HEENT: Denies headaches, ear pain, changes in vision, rhinorrhea, sore throat ?CV: Denies CP, palpitations, SOB, orthopnea ?Pulm: Denies SOB, cough, wheezing ?GI: Denies abdominal pain, nausea, vomiting, diarrhea, constipation +left abdominal pain, nausea ?GU: Denies dysuria, hematuria, frequency, vaginal discharge ?Msk: Denies muscle cramps, joint pains ?Neuro: Denies weakness, numbness, tingling ?Skin: Denies rashes, bruising ?Psych: Denies depression, anxiety, hallucinations ? ?   ?Objective:  ?  ?Blood pressure (!) 160/70, pulse 64, temperature 98.4 ?F (36.9 ?C), temperature source  Oral, height '5\' 4"'$  (1.626 m), weight 146 lb 9.6 oz (66.5 kg), SpO2 99 %. ? ?Gen. Pleasant, well-nourished, in no distress, normal affect   ?HEENT: [place]/AT, face symmetric, conjunctiva clear, no scleral icterus, PERRLA, EOMI, nares patent without drainage, ?Lungs: no accessory muscle use, CTAB, no wheezes or rales ?Cardiovascular: RRR, no m/r/g, no peripheral edema ?Abdomen: BS present, soft, NT/ND, no hepatosplenomegaly. ?Musculoskeletal: No deformities, no cyanosis or clubbing, normal tone ?Neuro:  A&Ox3, CN II-XII intact, normal gait ?Skin:  Warm, no lesions/ rash ? ? ?Wt Readings from Last 3 Encounters:  ?09/02/21 146 lb 9.6 oz (66.5 kg)  ?09/01/21 146 lb (66.2 kg)  ?12/30/20 144 lb (65.3 kg)  ? ? ?Lab Results  ?Component Value Date  ? WBC 7.7 09/01/2021  ? HGB 13.9 09/01/2021  ? HCT 43.6 09/01/2021  ? PLT 222 09/01/2021  ? GLUCOSE 111 (H) 09/01/2021  ? CHOL 144 12/30/2020  ? TRIG 127.0 12/30/2020  ? HDL 57.90 12/30/2020  ? LDLDIRECT 101.0 06/28/2010  ? [place] 60 12/30/2020  ? ALT 9 09/01/2021  ? AST 15 09/01/2021  ? NA 140 09/01/2021  ? K 3.8 09/01/2021  ? CL 103 09/01/2021  ? CREATININE 0.90 09/01/2021  ? BUN 13 09/01/2021  ? CO2 28 09/01/2021  ? TSH 1.13 12/30/2020  ? HGBA1C 7.1 (H) 04/04/2021  ? MICROALBUR <0.7 12/30/2020  ? ? ?Assessment/Plan: ? ?LUQ pain ?-Reviewed work-up from 09/01/2021  in ED including labs, UA, and CT abdomen pelvis which were normal. ?-Repeat UA in clinic negative. ?-Discussed other possible causes of symptoms including gastric ulcer, GERD, increased flatus. ?-Reassuring colonoscopy from 2015 normal without polyps or diverticula. ?-Continue supportive care ?-advance diet as tolerated ?-Tylenol as needed for pain ?-GI referral placed ?-Follow-up in ED for continued/worsening symptoms ?- Plan: POCT urinalysis dipstick, traMADol (ULTRAM) 50 MG tablet, Ambulatory referral to Gastroenterology ? ?F/u as needed ? ?Grier Mitts, MD ?

## 2021-09-05 ENCOUNTER — Encounter: Payer: Self-pay | Admitting: Physician Assistant

## 2021-09-09 ENCOUNTER — Encounter: Payer: Self-pay | Admitting: Family Medicine

## 2021-09-22 ENCOUNTER — Encounter: Payer: Self-pay | Admitting: Physician Assistant

## 2021-09-22 ENCOUNTER — Ambulatory Visit: Payer: Medicare HMO | Admitting: Physician Assistant

## 2021-09-22 VITALS — BP 138/76 | HR 62 | Ht 64.0 in | Wt 146.2 lb

## 2021-09-22 DIAGNOSIS — R195 Other fecal abnormalities: Secondary | ICD-10-CM | POA: Diagnosis not present

## 2021-09-22 DIAGNOSIS — R109 Unspecified abdominal pain: Secondary | ICD-10-CM

## 2021-09-22 MED ORDER — PLENVU 140 G PO SOLR: 1.0000 | ORAL | 0 refills | Status: DC

## 2021-09-22 NOTE — Patient Instructions (Signed)
If you are age 74 or older, your body mass index should be between 23-30. Your Body mass index is 25.1 kg/m?Marland Kitchen If this is out of the aforementioned range listed, please consider follow up with your Primary Care Provider. ?________________________________________________________ ? ?The [facility] providers would like to encourage you to use [facility] to communicate with providers for non-urgent requests or questions.  Due to long hold times on the telephone, sending your provider a message by [facility] may be a faster and more efficient way to get a response.  Please allow 48 business hours for a response.  Please remember that this is for non-urgent requests.  ?_______________________________________________________ ? ?You have been scheduled for a colonoscopy. Please follow written instructions given to you at your visit today.  ?Please pick up your prep supplies at the pharmacy within the next 1-3 days. ?If you use inhalers (even only as needed), please bring them with you on the day of your procedure. ? ?Follow up as needed. ? ?Thank you for entrusting me with your care and choosing [facility]. ? ?Nicoletta Ba, PA-C ?

## 2021-09-22 NOTE — Progress Notes (Signed)
? ?Subjective:  ? ? Patient ID: Gabriella Farley, female    DOB: [birth year], 76 y.o.   MRN: [id number] ? ?HPI ? Gabriella Farley is a pleasant 76 year old white female, known remotely to Dr. Deatra Ina having been seen here in 2015 for colonoscopy.  This was done for history of polyps and was a negative exam. ? ?She comes in with complaint of left-sided abdominal pain which has been present for about a month.  She said at initial onset it was dull nonradiating and located in the left mid abdomen.  She required ER visit on 09/01/2020 because the pain became worse and was sharp in nature and at that time with some radiation into her back.  No associated nausea or vomiting, no fever or chills.  She did not have any diarrhea or melena. ?She has noticed that her stools have been somewhat narrower over the past couple of months. ?She had CT of the abdomen and pelvis done on 09/01/2020 that showed multiple tiny gallstones, no gallbladder wall thickening, 6 mm cyst in the right kidney and otherwise negative exam. ?Labs on 09/01/2021 with normal CBC, c-Met lipase and UA. ? ?She is continuing to have the same left mid quadrant abdominal pain which she says is not as bad as it had been when she went to the emergency room and is more dull in nature She thinks that she can note some increase in symptoms after she eats nuts but has not noted any other changes with p.o. intake.  Appetite is okay, no nausea or vomiting, continues to have narrower caliber stools, no melena or hematochezia. ? ?Patient has Voltaren on her med list as a twice daily dose, says she does not take this regularly, uses only occasionally. ?She had been given Bentyl at the time she was seen in the emergency room but said that did not help, her PCP had given her tramadol which she says she took for few days and was helpful but has not required that since. ?Other medical problems include hypertension, diabetes mellitus, anxiety and hypothyroidism. ? ?Review of Systems.Pertinent  positive and negative review of systems were noted in the above HPI section.  All other review of systems was otherwise negative.  ? ?Outpatient Encounter Medications as of 09/22/2021  ?Medication Sig  ? amLODipine (NORVASC) 5 MG tablet Take 1 tablet (5 mg total) by mouth daily.  ? diclofenac (VOLTAREN) 75 MG EC tablet Take 1 tablet (75 mg total) by mouth 2 (two) times daily.  ? dicyclomine (BENTYL) 20 MG tablet Take 1 tablet (20 mg total) by mouth 2 (two) times daily as needed for spasms.  ? glipiZIDE (GLUCOTROL) 5 MG tablet Take 1 tablet (5 mg total) by mouth 2 (two) times daily before a meal.  ? glucose blood test strip 1 each by Other route 1 day or 1 dose. One Touch test strips: ?Use as instructed  ? levothyroxine (SYNTHROID) 75 MCG tablet Take 1 tablet (75 mcg total) by mouth daily.  ? PEG-KCl-NaCl-NaSulf-Na Asc-C (PLENVU) 140 g SOLR Take 1 kit by mouth as directed.  ? rosuvastatin (CRESTOR) 10 MG tablet Take 1 tablet (10 mg total) by mouth daily.  ? ?No facility-administered encounter medications on file as of 09/22/2021.  ? ?Allergies  ?Allergen Reactions  ? Penicillins Shortness Of Breath  ? Codeine   ?  Sick to stomach  ? Sulfonamide Derivatives   ?  welts  ? ?Patient Active Problem List  ? Diagnosis Date Noted  ? Controlled diabetes mellitus type  2 with complications ([place]) 16/02/9603  ? Hypothyroidism 02/26/2007  ? Hyperlipemia, mixed 02/26/2007  ? Anxiety state 02/26/2007  ? Essential hypertension 02/26/2007  ? ?Social History  ? ?Socioeconomic History  ? Marital status: Married  ?  Spouse name: Not on file  ? Number of children: 2  ? Years of education: Not on file  ? Highest education level: Not on file  ?Occupational History  ? Occupation: retired  ?Tobacco Use  ? Smoking status: Never  ? Smokeless tobacco: Never  ?Vaping Use  ? Vaping Use: Never used  ?Substance and Sexual Activity  ? Alcohol use: No  ?  Alcohol/week: 0.0 standard drinks  ? Drug use: No  ? Sexual activity: Not on file  ?Other Topics  Concern  ? Not on file  ?Social History Narrative  ? Not on file  ? ?Social Determinants of Health  ? ?Financial Resource Strain: Not on file  ?Food Insecurity: Not on file  ?Transportation Needs: Not on file  ?Physical Activity: Not on file  ?Stress: Not on file  ?Social Connections: Not on file  ?Intimate Partner Violence: Not on file  ? ? ?Gabriella Farley family history includes Alcohol abuse in an other family member; Breast cancer in her cousin; Breast cancer (age of onset: 3) in her paternal aunt; Diabetes in an other family member; Hyperlipidemia in an other family member; Hypertension in an other family member; Other in an other family member; Stroke in an other family member. ? ? ?   ?Objective:  ?  ?Vitals:  ? 09/22/21 0902  ?BP: 138/76  ?Pulse: 62  ?SpO2: 97%  ? ? ?Physical Exam Well-developed well-nourished older WF in no acute distress. Pleasant   Height, [id number]  BMI25.1 ? ?HEENT; nontraumatic normocephalic, EOMI, PE R LA, sclera anicteric. ?Oropharynx;not examined today ?Neck; supple, no JVD ?Cardiovascular; regular rate and rhythm with S1-S2, no murmur rub or gallop ?Pulmonary; Clear bilaterally ?Abdomen; soft,  there is tenderness in the left mid quadrant , nondistended, no palpable mass or hepatosplenomegaly, bowel sounds are active ?Rectal;not done today ?Skin; benign exam, no jaundice rash or appreciable lesions ?Extremities; no clubbing cyanosis or edema skin warm and dry ?Neuro/Psych; alert and oriented x4, grossly nonfocal mood and affect appropriate  ? ? ? ?   ?Assessment & Plan:  ? ?#94 76 year old white female with 1 month history of left mid quadrant abdominal discomfort, initially dull and then progressed to being sharp when she was seen in the emergency room on 09/01/2020.  Discomfort is continuing, back to feeling dull and nonradiating ? ?In the emergency room with CT of the abdomen pelvis unrevealing as to etiology. ?She has also noticed a change in stool caliber over the past couple  of months with narrower caliber stools. ?Last colonoscopy May 2015 done for history of polyps and was negative.  ( Dr. Deatra Ina) ?Adenomatous polyp 2011 ? ?Etiology of her pain is not clear, she does have tenderness in the left mid quadrant.  Rule out occult colon lesion, subtle inflammatory process ? ?#2 hypertension ?#3.  Diabetes mellitus ?#4.  Anxiety ?#5.  Hypothyroidism ? ?Plan; patient will be scheduled for colonoscopy with Dr. Hilarie Fredrickson.  Procedure was discussed in detail with the patient including indications risk and benefits and she is agreeable to proceed. ?She is asked to call if she has any worsening of symptoms in the interim until colonoscopy is done. ?Further recommendations pending findings at colonoscopy. ? ?Alfredia Ferguson PA-C ?09/22/2021 ? ? ?Cc: Billie Ruddy, MD ?  ?

## 2021-09-22 NOTE — Progress Notes (Signed)
Addendum: Reviewed and agree with assessment and management plan. Jamilia Jacques M, MD  

## 2021-09-23 DIAGNOSIS — H5213 Myopia, bilateral: Secondary | ICD-10-CM | POA: Diagnosis not present

## 2021-09-23 DIAGNOSIS — H2513 Age-related nuclear cataract, bilateral: Secondary | ICD-10-CM | POA: Diagnosis not present

## 2021-09-23 DIAGNOSIS — E119 Type 2 diabetes mellitus without complications: Secondary | ICD-10-CM | POA: Diagnosis not present

## 2021-09-23 LAB — HM DIABETES EYE EXAM

## 2021-09-28 ENCOUNTER — Encounter: Payer: Self-pay | Admitting: Family Medicine

## 2021-10-04 ENCOUNTER — Encounter: Payer: Self-pay | Admitting: Internal Medicine

## 2021-10-11 ENCOUNTER — Ambulatory Visit (AMBULATORY_SURGERY_CENTER): Payer: Medicare HMO | Admitting: Internal Medicine

## 2021-10-11 ENCOUNTER — Encounter: Payer: Self-pay | Admitting: Internal Medicine

## 2021-10-11 VITALS — BP 124/65 | HR 70 | Temp 97.3°F | Resp 16 | Ht 64.0 in | Wt 146.0 lb

## 2021-10-11 DIAGNOSIS — R109 Unspecified abdominal pain: Secondary | ICD-10-CM

## 2021-10-11 DIAGNOSIS — D126 Benign neoplasm of colon, unspecified: Secondary | ICD-10-CM | POA: Diagnosis not present

## 2021-10-11 DIAGNOSIS — D128 Benign neoplasm of rectum: Secondary | ICD-10-CM | POA: Diagnosis not present

## 2021-10-11 DIAGNOSIS — D122 Benign neoplasm of ascending colon: Secondary | ICD-10-CM | POA: Diagnosis not present

## 2021-10-11 DIAGNOSIS — K629 Disease of anus and rectum, unspecified: Secondary | ICD-10-CM | POA: Diagnosis not present

## 2021-10-11 DIAGNOSIS — R195 Other fecal abnormalities: Secondary | ICD-10-CM | POA: Diagnosis not present

## 2021-10-11 DIAGNOSIS — K648 Other hemorrhoids: Secondary | ICD-10-CM | POA: Diagnosis not present

## 2021-10-11 HISTORY — PX: COLONOSCOPY: SHX174

## 2021-10-11 MED ORDER — SODIUM CHLORIDE 0.9 % IV SOLN: 500.0000 mL | Freq: Once | INTRAVENOUS | Status: DC

## 2021-10-11 NOTE — Progress Notes (Unsigned)
See office note dated 09/22/2021 for details.  H&P from Gabriella Farley on that date She remains appropriate for [place] colonoscopy today

## 2021-10-11 NOTE — Op Note (Signed)
[facility] Patient Name: Gabriella Farley Procedure Date: 10/11/2021 4:07 PM MRN: [id number] Endoscopist: Jerene Bears , MD Age: 76 Referring MD:  Date of Birth: Feb 20, 1946 Gender: Female Account #: [id number] Procedure:                Colonoscopy Indications:              Abdominal pain in the left lower quadrant,                            Incidental change in bowel habits noted; last                            colonoscopy normal in 2015 (small tubular adenoma                            in 2011) Medicines:                Monitored Anesthesia Care Procedure:                Pre-Anesthesia Assessment:                           - Prior to the procedure, a History and Physical                            was performed, and patient medications and                            allergies were reviewed. The patient's tolerance of                            previous anesthesia was also reviewed. The risks                            and benefits of the procedure and the sedation                            options and risks were discussed with the patient.                            All questions were answered, and informed consent                            was obtained. Prior Anticoagulants: The patient has                            taken no previous anticoagulant or antiplatelet                            agents. ASA Grade Assessment: II - A patient with                            mild systemic disease. After reviewing the risks  and benefits, the patient was deemed in                            satisfactory condition to undergo the procedure.                           After obtaining informed consent, the colonoscope                            was passed under direct vision. Throughout the                            procedure, the patient's blood pressure, pulse, and                            oxygen saturations were monitored continuously. The                             PCF-HQ190L Colonoscope was introduced through the                            anus and advanced to the terminal ileum. The                            colonoscopy was performed without difficulty. The                            patient tolerated the procedure well. The quality                            of the bowel preparation was good. The ileocecal                            valve, appendiceal orifice, and rectum were                            photographed. Scope In: 4:13:00 PM Scope Out: 4:30:23 PM Scope Withdrawal Time: 0 hours 13 minutes 50 seconds  Total Procedure Duration: 0 hours 17 minutes 23 seconds  Findings:                 The digital rectal exam was normal.                           The terminal ileum appeared normal.                           Two sessile polyps were found in the ascending                            colon. The polyps were 4 to 6 mm in size. These                            polyps were removed with a cold snare. Resection  and retrieval were complete.                           A localized area of erythematous and edematous                            mucosa was found in the distal rectum near the                            dentate line (query prolapse change). This was                            biopsied with a cold forceps for histology to                            exclude adenomatous change.                           Internal hemorrhoids were found during                            retroflexion. The hemorrhoids were small. Complications:            No immediate complications. Estimated Blood Loss:     Estimated blood loss was minimal. Impression:               - The examined portion of the ileum was normal.                           - Two 4 to 6 mm polyps in the ascending colon,                            removed with a cold snare. Resected and retrieved.                           - Erythematous/edematous mucosa in  the distal                            rectum. Biopsied. Query prolapse change.                           - Small internal hemorrhoids. Recommendation:           - Patient has a contact number available for                            emergencies. The signs and symptoms of potential                            delayed complications were discussed with the                            patient. Return to normal activities tomorrow.                            Written  discharge instructions were provided to the                            patient.                           - Resume previous diet.                           - Continue present medications. Can continue Bentyl                            20 mg 2 to 3 times daily as needed for abdominal                            pain/spasms.                           - Add Benefiber 2 teaspoons daily.                           - Await pathology results.                           - Repeat colonoscopy is recommended. The                            colonoscopy date will be determined after pathology                            results from today's exam become available for                            review. Jerene Bears, MD 10/11/2021 4:38:51 PM This report has been signed electronically.

## 2021-10-11 NOTE — Progress Notes (Unsigned)
VS  DT ? ?Pt's states no medical or surgical changes since previsit or office visit. ? ?

## 2021-10-11 NOTE — Progress Notes (Unsigned)
Pt awake, report to RN, VVS  °

## 2021-10-11 NOTE — Patient Instructions (Signed)
Resume previous diet and medications. Add Benefiber 2 teaspoons daily. Awaiting pathology results.   YOU HAD AN ENDOSCOPIC PROCEDURE TODAY AT [facility]:   Refer to the procedure report that was given to you for any specific questions about what was found during the examination.  If the procedure report does not answer your questions, please call your gastroenterologist to clarify.  If you requested that your care partner not be given the details of your procedure findings, then the procedure report has been included in a sealed envelope for you to review at your convenience later.  YOU SHOULD EXPECT: Some feelings of bloating in the abdomen. Passage of more gas than usual.  Walking can help get rid of the air that was put into your GI tract during the procedure and reduce the bloating. If you had a lower endoscopy (such as a colonoscopy or flexible sigmoidoscopy) you may notice spotting of blood in your stool or on the toilet paper. If you underwent a bowel prep for your procedure, you may not have a normal bowel movement for a few days.  Please Note:  You might notice some irritation and congestion in your nose or some drainage.  This is from the oxygen used during your procedure.  There is no need for concern and it should clear up in a day or so.  SYMPTOMS TO REPORT IMMEDIATELY:  Following lower endoscopy (colonoscopy or flexible sigmoidoscopy):  Excessive amounts of blood in the stool  Significant tenderness or worsening of abdominal pains  Swelling of the abdomen that is new, acute  Fever of 100F or higher  For urgent or emergent issues, a gastroenterologist can be reached at any hour by calling [phone]. Do not use MyChart messaging for urgent concerns.    DIET:  We do recommend a small meal at first, but then you may proceed to your regular diet.  Drink plenty of fluids but you should avoid alcoholic beverages for 24 hours.  ACTIVITY:  You should plan to take it  easy for the rest of today and you should NOT DRIVE or use heavy machinery until tomorrow (because of the sedation medicines used during the test).    FOLLOW UP: Our staff will call the number listed on your records 48-72 hours following your procedure to check on you and address any questions or concerns that you may have regarding the information given to you following your procedure. If we do not reach you, we will leave a message.  We will attempt to reach you two times.  During this call, we will ask if you have developed any symptoms of COVID 19. If you develop any symptoms (ie: fever, flu-like symptoms, shortness of breath, cough etc.) before then, please call [phone].  If you test positive for Covid 19 in the 2 weeks post procedure, please call and report this information to [place].    If any biopsies were taken you will be contacted by phone or by letter within the next 1-3 weeks.  Please call us at [phone] if you have not heard about the biopsies in 3 weeks.    SIGNATURES/CONFIDENTIALITY: You and/or your care partner have signed paperwork which will be entered into your electronic medical record.  These signatures attest to the fact that that the information above on your After Visit Summary has been reviewed and is understood.  Full responsibility of the confidentiality of this discharge information lies with you and/or your care-partner.

## 2021-10-11 NOTE — Progress Notes (Signed)
Called to room to assist during endoscopic procedure.  Patient ID and intended procedure confirmed with present staff. Received instructions for my participation in the procedure from the performing physician.  

## 2021-10-12 ENCOUNTER — Telehealth: Payer: Self-pay | Admitting: *Deleted

## 2021-10-12 NOTE — Telephone Encounter (Signed)
  Follow up Call-     10/11/2021    2:59 PM  Call back number  Post procedure Call Back phone  # [phone]  Permission to leave phone message Yes     Patient questions:  Do you have a fever, pain , or abdominal swelling? No. Pain Score  0 *  Have you tolerated food without any problems? Yes.    Have you been able to return to your normal activities? Yes.    Do you have any questions about your discharge instructions: Diet   No. Medications  No. Follow up visit  No.  Do you have questions or concerns about your Care? No.  Actions: * If pain score is 4 or above: No action needed, pain <4.

## 2021-10-25 ENCOUNTER — Telehealth: Payer: Self-pay | Admitting: Internal Medicine

## 2021-10-25 NOTE — Telephone Encounter (Signed)
I spoke to patient by phone, see pathology result note from recent colonoscopy for details

## 2021-10-25 NOTE — Telephone Encounter (Signed)
Patient called requesting to get her path results said she was expecting for Dr. Hilarie Fredrickson to call back.

## 2021-10-28 ENCOUNTER — Telehealth: Payer: Self-pay | Admitting: Internal Medicine

## 2021-10-28 ENCOUNTER — Other Ambulatory Visit: Payer: Self-pay | Admitting: Family Medicine

## 2021-10-28 NOTE — Telephone Encounter (Signed)
Inbound call from patient wanting to schedule her Flex Sig. Patient was scheduled for procedure on 8/28 at 10:30. I also got patient scheduled for a PV on 7/31 at 1:00. Patient stated that when Dr. Hilarie Fredrickson went over her results from her colonoscopy that he stated all she would have to do is 2  enemas and that would be it. I advised patient that I would double check for her. Can you help me determine if patient needs PV or not?  Please advise.

## 2021-10-31 NOTE — Telephone Encounter (Signed)
Yes she needs to keep the previsit since the procedure is not scheduled until August. There is no charge for the previsit.

## 2021-11-02 NOTE — Telephone Encounter (Signed)
Called patient to advise. Patient stated she understood.

## 2021-11-15 ENCOUNTER — Other Ambulatory Visit: Payer: Self-pay | Admitting: Family Medicine

## 2021-11-15 DIAGNOSIS — E118 Type 2 diabetes mellitus with unspecified complications: Secondary | ICD-10-CM

## 2021-12-16 NOTE — Telephone Encounter (Signed)
Dr Hilarie Fredrickson,  Can you advise on the prep for her flex?  She is asking about only having to do 2 enemas ONLY.  PV is Monday 7-31  Thanks, Lelan Pons

## 2021-12-16 NOTE — Telephone Encounter (Signed)
2 enemas will suffice Thanks Bartolo Darter

## 2021-12-16 NOTE — Telephone Encounter (Signed)
Noted and Thanks sir  Lelan Pons PV

## 2021-12-19 ENCOUNTER — Ambulatory Visit (AMBULATORY_SURGERY_CENTER): Payer: Self-pay

## 2021-12-19 VITALS — Ht 64.0 in | Wt 148.0 lb

## 2021-12-19 DIAGNOSIS — Z860100 Personal history of colon polyps, unspecified: Secondary | ICD-10-CM

## 2021-12-19 NOTE — Progress Notes (Signed)
No egg or soy allergy known to patient  No issues known to pt with past sedation with any surgeries or procedures Patient denies ever being told they had issues or difficulty with intubation  No FH of Malignant Hyperthermia Pt is not on diet pills Pt is not on  home 02  Pt is not on blood thinners  Pt denies issues with constipation  No A fib or A flutter Have any cardiac testing pending--denied Pt instructed to use Single[employer] or GoodRx for a price reduction on prep   

## 2022-01-06 ENCOUNTER — Encounter: Payer: Self-pay | Admitting: Internal Medicine

## 2022-01-16 ENCOUNTER — Other Ambulatory Visit: Payer: Self-pay | Admitting: Internal Medicine

## 2022-01-16 ENCOUNTER — Encounter: Payer: Self-pay | Admitting: Internal Medicine

## 2022-01-16 ENCOUNTER — Ambulatory Visit (AMBULATORY_SURGERY_CENTER): Payer: Medicare HMO | Admitting: Internal Medicine

## 2022-01-16 VITALS — BP 127/52 | HR 59 | Temp 97.8°F | Resp 17 | Ht 64.0 in | Wt 148.0 lb

## 2022-01-16 DIAGNOSIS — Z86010 Personal history of colonic polyps: Secondary | ICD-10-CM | POA: Diagnosis not present

## 2022-01-16 DIAGNOSIS — E119 Type 2 diabetes mellitus without complications: Secondary | ICD-10-CM | POA: Diagnosis not present

## 2022-01-16 DIAGNOSIS — K621 Rectal polyp: Secondary | ICD-10-CM | POA: Diagnosis not present

## 2022-01-16 DIAGNOSIS — D128 Benign neoplasm of rectum: Secondary | ICD-10-CM

## 2022-01-16 DIAGNOSIS — Z09 Encounter for follow-up examination after completed treatment for conditions other than malignant neoplasm: Secondary | ICD-10-CM | POA: Diagnosis not present

## 2022-01-16 DIAGNOSIS — I10 Essential (primary) hypertension: Secondary | ICD-10-CM | POA: Diagnosis not present

## 2022-01-16 HISTORY — PX: SIGMOIDOSCOPY: SUR1295

## 2022-01-16 HISTORY — PX: FLEXIBLE SIGMOIDOSCOPY: SHX1649

## 2022-01-16 MED ORDER — SODIUM CHLORIDE 0.9 % IV SOLN: 500.0000 mL | Freq: Once | INTRAVENOUS | Status: DC

## 2022-01-16 NOTE — Progress Notes (Signed)
Pt's states no medical or surgical changes since previsit or office visit. 

## 2022-01-16 NOTE — Progress Notes (Signed)
Called to room to assist during endoscopic procedure.  Patient ID and intended procedure confirmed with present staff. Received instructions for my participation in the procedure from the performing physician.  

## 2022-01-16 NOTE — Progress Notes (Signed)
Report to PACU, RN, vss, BBS= Clear.  

## 2022-01-16 NOTE — Op Note (Signed)
[facility] Patient Name: Gabriella Farley Procedure Date: 01/16/2022 10:46 AM MRN: [id number] Endoscopist: Jerene Bears , MD Age: 76 Referring MD:  Date of Birth: 09/16/1945 Gender: Female Account #: [id number] Procedure:                Flexible Sigmoidoscopy Indications:              Personal history of colonic polyps including                            possible distal rectal nodular mucosa with                            adenomatous change seen in May 2023, repeat                            evaluation of that area Medicines:                Monitored Anesthesia Care Procedure:                Pre-Anesthesia Assessment:                           - Prior to the procedure, a History and Physical                            was performed, and patient medications and                            allergies were reviewed. The patient's tolerance of                            previous anesthesia was also reviewed. The risks                            and benefits of the procedure and the sedation                            options and risks were discussed with the patient.                            All questions were answered, and informed consent                            was obtained. Prior Anticoagulants: The patient has                            taken no previous anticoagulant or antiplatelet                            agents. ASA Grade Assessment: II - A patient with                            mild systemic disease. After reviewing the risks  and benefits, the patient was deemed in                            satisfactory condition to undergo the procedure.                           After obtaining informed consent, the scope was                            passed under direct vision. The 0441 PCF-H190TL                            Slim SB Colonoscope was introduced through the anus                            and advanced to the sigmoid colon. The flexible                             sigmoidoscopy was accomplished without difficulty.                            The patient tolerated the procedure well. The                            quality of the bowel preparation was excellent. Scope In: 10:54:25 AM Scope Out: 10:59:42 AM Total Procedure Duration: 0 hours 5 minutes 17 seconds  Findings:                 The digital rectal exam was normal.                           The proximal rectum, mid rectum, recto-sigmoid                            colon and sigmoid colon appeared normal.                           An area of nodular mucosa was found in the distal                            rectum. This is most consistent with prolapse                            change. Biopsies were taken with a cold forceps for                            histology to exclude adenoma. Complications:            No immediate complications. Estimated Blood Loss:     Estimated blood loss was minimal. Impression:               - The proximal rectum, mid rectum, recto-sigmoid                            colon and sigmoid  colon are normal.                           - Nodular mucosa in the distal rectum. Biopsied. Recommendation:           - Patient has a contact number available for                            emergencies. The signs and symptoms of potential                            delayed complications were discussed with the                            patient. Return to normal activities tomorrow.                            Written discharge instructions were provided to the                            patient.                           - Resume previous diet.                           - Await pathology results. If adenomatous change                            then referral to colorectal surgery for resection                            as this tissue is in the hemorrhoidal plane. Jerene Bears, MD 01/16/2022 11:08:51 AM This report has been signed electronically.

## 2022-01-16 NOTE — Patient Instructions (Signed)
Resume previous medications.  Await results for final recommendations.  Handouts on findings given to patient.     YOU HAD AN ENDOSCOPIC PROCEDURE TODAY AT [facility]:   Refer to the procedure report that was given to you for any specific questions about what was found during the examination.  If the procedure report does not answer your questions, please call your gastroenterologist to clarify.  If you requested that your care partner not be given the details of your procedure findings, then the procedure report has been included in a sealed envelope for you to review at your convenience later.  YOU SHOULD EXPECT: Some feelings of bloating in the abdomen. Passage of more gas than usual.  Walking can help get rid of the air that was put into your GI tract during the procedure and reduce the bloating. If you had a lower endoscopy (such as a colonoscopy or flexible sigmoidoscopy) you may notice spotting of blood in your stool or on the toilet paper. If you underwent a bowel prep for your procedure, you may not have a normal bowel movement for a few days.  Please Note:  You might notice some irritation and congestion in your nose or some drainage.  This is from the oxygen used during your procedure.  There is no need for concern and it should clear up in a day or so.  SYMPTOMS TO REPORT IMMEDIATELY:  Following lower endoscopy (colonoscopy or flexible sigmoidoscopy):  Excessive amounts of blood in the stool  Significant tenderness or worsening of abdominal pains  Swelling of the abdomen that is new, acute  Fever of 100F or higher   For urgent or emergent issues, a gastroenterologist can be reached at any hour by calling [phone]. Do not use MyChart messaging for urgent concerns.    DIET:  We do recommend a small meal at first, but then you may proceed to your regular diet.  Drink plenty of fluids but you should avoid alcoholic beverages for 24 hours.  ACTIVITY:  You should  plan to take it easy for the rest of today and you should NOT DRIVE or use heavy machinery until tomorrow (because of the sedation medicines used during the test).    FOLLOW UP: Our staff will call the number listed on your records the next business day following your procedure.  We will call around 7:15- 8:00 am to check on you and address any questions or concerns that you may have regarding the information given to you following your procedure. If we do not reach you, we will leave a message.  If you develop any symptoms (ie: fever, flu-like symptoms, shortness of breath, cough etc.) before then, please call [phone].  If you test positive for Covid 19 in the 2 weeks post procedure, please call and report this information to [place].    If any biopsies were taken you will be contacted by phone or by letter within the next 1-3 weeks.  Please call us at [phone] if you have not heard about the biopsies in 3 weeks.    SIGNATURES/CONFIDENTIALITY: You and/or your care partner have signed paperwork which will be entered into your electronic medical record.  These signatures attest to the fact that that the information above on your After Visit Summary has been reviewed and is understood.  Full responsibility of the confidentiality of this discharge information lies with you and/or your care-partner.

## 2022-01-16 NOTE — Progress Notes (Signed)
GASTROENTEROLOGY PROCEDURE H&P NOTE   Primary Care Physician: Laurey Morale, MD    Reason for Procedure:   Hx of polyps, query adenoma in distal rectum  Plan:    Flex sig  Patient is appropriate for endoscopic procedure(s) in the ambulatory ([facility]) setting.  The nature of the procedure, as well as the risks, benefits, and alternatives were carefully and thoroughly reviewed with the patient. Ample time for discussion and questions allowed. The patient understood, was satisfied, and agreed to proceed.     HPI: Gabriella Farley is a 76 y.o. female who presents for flexible sigmoid.  Medical history as below.  Tolerated the prep.  No recent chest pain or shortness of breath.  No abdominal pain today.  Past Medical History:  Diagnosis Date   Allergy    slight allergy to pollen   Anxiety    no per pt   Cataract    Colon polyps    DJD (degenerative joint disease)    Gallstones    Hyperlipemia    Hypertension    Hypothyroidism    Migraines    none since BP meds 51yr ago per pt   Visual floaters    retinal   sees Dr JIshmael Holter   Past Surgical History:  Procedure Laterality Date   childbirth      x 2   COLONOSCOPY  10/03/2013   per Dr. KDeatra Ina clear, repeat in 7 yrs    [facility]     Prior to Admission medications   Medication Sig Start Date End Date Taking? Authorizing Provider  amLODipine (NORVASC) 5 MG tablet Take 1 tablet (5 mg total) by mouth daily. 12/30/20  Yes FLaurey Morale MD  glipiZIDE (GLUCOTROL) 5 MG tablet TAKE ONE TABLET BY MOUTH TWICE A DAY BEFORE A MEAL 11/16/21  Yes FLaurey Morale MD  levothyroxine (SYNTHROID) 75 MCG tablet Take 1 tablet (75 mcg total) by mouth daily. 12/30/20  Yes FLaurey Morale MD  [facility] USE TO TEST ONCE DAY 10/31/21  Yes FLaurey Morale MD  rosuvastatin (CRESTOR) 10 MG tablet Take 1 tablet (10 mg total) by mouth daily. 12/30/20  Yes FLaurey Morale MD  diclofenac  (VOLTAREN) 75 MG EC tablet Take 1 tablet (75 mg total) by mouth 2 (two) times daily. 06/03/21   FLaurey Morale MD    Current Outpatient Medications  Medication Sig Dispense Refill   amLODipine (NORVASC) 5 MG tablet Take 1 tablet (5 mg total) by mouth daily. 90 tablet 3   glipiZIDE (GLUCOTROL) 5 MG tablet TAKE ONE TABLET BY MOUTH TWICE A DAY BEFORE A MEAL 180 tablet 0   levothyroxine (SYNTHROID) 75 MCG tablet Take 1 tablet (75 mcg total) by mouth daily. 90 tablet 3   ONETOUCH VERIO test strip USE TO TEST ONCE DAY 50 strip 2   rosuvastatin (CRESTOR) 10 MG tablet Take 1 tablet (10 mg total) by mouth daily. 90 tablet 3   diclofenac (VOLTAREN) 75 MG EC tablet Take 1 tablet (75 mg total) by mouth 2 (two) times daily. 60 tablet 5   Current Facility-Administered Medications  Medication Dose Route Frequency Provider Last Rate Last Admin   0.9 %  sodium chloride infusion  500 mL Intravenous Once Derelle Cockrell, JLajuan Lines MD        Allergies as of 01/16/2022 - Review Complete 01/16/2022  Allergen Reaction Noted   Penicillins Shortness Of Breath  02/26/2007   Codeine  02/26/2007   Sulfonamide derivatives  02/26/2007    Family History  Problem Relation Age of Onset   Breast cancer Paternal Aunt 90   Breast cancer Cousin        paternal   Alcohol abuse Other    Diabetes Other    Hyperlipidemia Other    Hypertension Other    Stroke Other    Other Other        respiratory disease/weight disorder   Colon cancer Neg Hx    Esophageal cancer Neg Hx    Rectal cancer Neg Hx    Ulcerative colitis Neg Hx    Stomach cancer Neg Hx     Social History   Socioeconomic History   Marital status: Married    Spouse name: Not on file   Number of children: 2   Years of education: Not on file   Highest education level: Not on file  Occupational History   Occupation: retired  Tobacco Use   Smoking status: Never   Smokeless tobacco: Never  Vaping Use   Vaping Use: Never used  Substance and Sexual Activity    Alcohol use: No    Alcohol/week: 0.0 standard drinks of alcohol   Drug use: No   Sexual activity: Not on file  Other Topics Concern   Not on file  Social History Narrative   Not on file   Social Determinants of Health   Financial Resource Strain: Not on file  Food Insecurity: Not on file  Transportation Needs: Not on file  Physical Activity: Not on file  Stress: Not on file  Social Connections: Not on file  Intimate Partner Violence: Not on file    Physical Exam: Vital signs in last 24 hours: '@BP'$  (!) 128/58   Pulse 64   Temp 97.8 F (36.6 C) (Temporal)   Ht '5\' 4"'$  (1.626 m)   Wt 148 lb (67.1 kg)   SpO2 96%   BMI 25.40 kg/m  GEN: NAD EYE: Sclerae anicteric ENT: MMM CV: Non-tachycardic Pulm: CTA b/l GI: Soft, NT/ND NEURO:  Alert & Oriented x 3   [name], MD [facility]  01/16/2022 10:46 AM

## 2022-01-17 ENCOUNTER — Telehealth: Payer: Self-pay

## 2022-01-17 ENCOUNTER — Other Ambulatory Visit: Payer: Self-pay | Admitting: Family Medicine

## 2022-01-17 DIAGNOSIS — Z0000 Encounter for general adult medical examination without abnormal findings: Secondary | ICD-10-CM

## 2022-01-17 DIAGNOSIS — E782 Mixed hyperlipidemia: Secondary | ICD-10-CM

## 2022-01-17 DIAGNOSIS — I10 Essential (primary) hypertension: Secondary | ICD-10-CM

## 2022-01-17 NOTE — Telephone Encounter (Signed)
  Follow up Call-     01/16/2022   10:34 AM 10/11/2021    2:59 PM  Call back number  Post procedure Call Back phone  # [phone]  Permission to leave phone message Yes Yes     Patient questions:  Do you have a fever, pain , or abdominal swelling? No. Pain Score  0 *  Have you tolerated food without any problems? Yes.    Have you been able to return to your normal activities? Yes.    Do you have any questions about your discharge instructions: Diet   No. Medications  No. Follow up visit  No.  Do you have questions or concerns about your Care? No.  Actions: * If pain score is 4 or above: No action needed, pain <4.

## 2022-01-19 ENCOUNTER — Encounter: Payer: Self-pay | Admitting: Internal Medicine

## 2022-02-01 ENCOUNTER — Encounter: Payer: Self-pay | Admitting: Family Medicine

## 2022-02-01 ENCOUNTER — Ambulatory Visit (INDEPENDENT_AMBULATORY_CARE_PROVIDER_SITE_OTHER): Payer: Medicare HMO | Admitting: Family Medicine

## 2022-02-01 VITALS — BP 124/68 | HR 63 | Temp 97.6°F | Ht 64.0 in | Wt 147.1 lb

## 2022-02-01 DIAGNOSIS — E559 Vitamin D deficiency, unspecified: Secondary | ICD-10-CM | POA: Diagnosis not present

## 2022-02-01 DIAGNOSIS — E782 Mixed hyperlipidemia: Secondary | ICD-10-CM

## 2022-02-01 DIAGNOSIS — E039 Hypothyroidism, unspecified: Secondary | ICD-10-CM | POA: Diagnosis not present

## 2022-02-01 DIAGNOSIS — G629 Polyneuropathy, unspecified: Secondary | ICD-10-CM

## 2022-02-01 DIAGNOSIS — I10 Essential (primary) hypertension: Secondary | ICD-10-CM

## 2022-02-01 DIAGNOSIS — E118 Type 2 diabetes mellitus with unspecified complications: Secondary | ICD-10-CM

## 2022-02-01 DIAGNOSIS — E538 Deficiency of other specified B group vitamins: Secondary | ICD-10-CM

## 2022-02-01 DIAGNOSIS — F411 Generalized anxiety disorder: Secondary | ICD-10-CM

## 2022-02-01 DIAGNOSIS — R69 Illness, unspecified: Secondary | ICD-10-CM | POA: Diagnosis not present

## 2022-02-01 LAB — BASIC METABOLIC PANEL
BUN: 18 mg/dL (ref 6–23)
CO2: 27 mEq/L (ref 19–32)
Calcium: 9 mg/dL (ref 8.4–10.5)
Chloride: 103 mEq/L (ref 96–112)
Creatinine, Ser: 0.85 mg/dL (ref 0.40–1.20)
GFR: 67.72 mL/min (ref >60.00)
Glucose, Bld: 120 mg/dL — ABNORMAL HIGH (ref 70–99)
Potassium: 4.3 mEq/L (ref 3.5–5.1)
Sodium: 139 mEq/L (ref 135–145)

## 2022-02-01 LAB — LIPID PANEL
Cholesterol: 148 mg/dL (ref 0–200)
HDL: 48.1 mg/dL (ref >39.00)
LDL Cholesterol: 74 mg/dL (ref 0–99)
NonHDL: 99.55
Total CHOL/HDL Ratio: 3
Triglycerides: 130 mg/dL (ref 0.0–149.0)
VLDL: 26 mg/dL (ref 0.0–40.0)

## 2022-02-01 LAB — CBC WITH DIFFERENTIAL/PLATELET
Basophils Absolute: 0.1 10*3/uL (ref 0.0–0.1)
Basophils Relative: 1.2 % (ref 0.0–3.0)
Eosinophils Absolute: 0.2 10*3/uL (ref 0.0–0.7)
Eosinophils Relative: 3.4 % (ref 0.0–5.0)
HCT: 41.6 % (ref 36.0–46.0)
Hemoglobin: 13.8 g/dL (ref 12.0–15.0)
Lymphocytes Relative: 33.8 % (ref 12.0–46.0)
Lymphs Abs: 2.3 10*3/uL (ref 0.7–4.0)
MCHC: 33.2 g/dL (ref 30.0–36.0)
MCV: 84.7 fl (ref 78.0–100.0)
Monocytes Absolute: 0.6 10*3/uL (ref 0.1–1.0)
Monocytes Relative: 9.1 % (ref 3.0–12.0)
Neutro Abs: 3.6 10*3/uL (ref 1.4–7.7)
Neutrophils Relative %: 52.5 % (ref 43.0–77.0)
Platelets: 228 10*3/uL (ref 150.0–400.0)
RBC: 4.91 Mil/uL (ref 3.87–5.11)
RDW: 14.4 % (ref 11.5–15.5)
WBC: 6.9 10*3/uL (ref 4.0–10.5)

## 2022-02-01 LAB — T4, FREE: Free T4: 1.14 ng/dL (ref 0.60–1.60)

## 2022-02-01 LAB — HEPATIC FUNCTION PANEL
ALT: 19 U/L (ref 0–35)
AST: 24 U/L (ref 0–37)
Albumin: 4 g/dL (ref 3.5–5.2)
Alkaline Phosphatase: 75 U/L (ref 39–117)
Bilirubin, Direct: 0.2 mg/dL (ref 0.0–0.3)
Total Bilirubin: 0.7 mg/dL (ref 0.2–1.2)
Total Protein: 7.6 g/dL (ref 6.0–8.3)

## 2022-02-01 LAB — VITAMIN D 25 HYDROXY (VIT D DEFICIENCY, FRACTURES): VITD: 28.66 ng/mL — ABNORMAL LOW (ref 30.00–100.00)

## 2022-02-01 LAB — TSH: TSH: 1.3 u[IU]/mL (ref 0.35–5.50)

## 2022-02-01 LAB — T3, FREE: T3, Free: 2.5 pg/mL (ref 2.3–4.2)

## 2022-02-01 LAB — VITAMIN B12: Vitamin B-12: 237 pg/mL (ref 211–911)

## 2022-02-01 LAB — HEMOGLOBIN A1C: Hgb A1c MFr Bld: 6.5 % (ref 4.6–6.5)

## 2022-02-01 NOTE — Progress Notes (Signed)
Subjective:    Patient ID: Gabriella Farley, female    DOB: 1945-08-30, 76 y.o.   MRN: [id number]  HPI Here to follow up on issues. She feels well except for some tingling in both feet that started a few months ago. There is no pain or swelling. This only bothers her at night. Her BP has been stable. Her anxiety is well controlled. She does not check her glucoses, but she is very careful with her diet. She does not exercise.    Review of Systems  Constitutional: Negative.   HENT: Negative.    Eyes: Negative.   Respiratory: Negative.    Cardiovascular: Negative.   Gastrointestinal: Negative.   Genitourinary:  Negative for decreased urine volume, difficulty urinating, dyspareunia, dysuria, enuresis, flank pain, frequency, hematuria, pelvic pain and urgency.  Musculoskeletal: Negative.   Skin: Negative.   Neurological: Negative.  Negative for headaches.  Psychiatric/Behavioral: Negative.         Objective:   Physical Exam Constitutional:      General: She is not in acute distress.    Appearance: Normal appearance. She is well-developed.  HENT:     Head: Normocephalic and atraumatic.     Right Ear: External ear normal.     Left Ear: External ear normal.     Nose: Nose normal.     Mouth/Throat:     Pharynx: No oropharyngeal exudate.  Eyes:     General: No scleral icterus.    Conjunctiva/sclera: Conjunctivae normal.     Pupils: Pupils are equal, round, and reactive to light.  Neck:     Thyroid: No thyromegaly.     Vascular: No JVD.  Cardiovascular:     Rate and Rhythm: Normal rate and regular rhythm.     Heart sounds: Normal heart sounds. No murmur heard.    No friction rub. No gallop.  Pulmonary:     Effort: Pulmonary effort is normal. No respiratory distress.     Breath sounds: Normal breath sounds. No wheezing or rales.  Chest:     Chest wall: No tenderness.  Abdominal:     General: Bowel sounds are normal. There is no distension.     Palpations: Abdomen is soft.  There is no mass.     Tenderness: There is no abdominal tenderness. There is no guarding or rebound.  Musculoskeletal:        General: No tenderness. Normal range of motion.     Cervical back: Normal range of motion and neck supple.  Lymphadenopathy:     Cervical: No cervical adenopathy.  Skin:    General: Skin is warm and dry.     Findings: No erythema or rash.  Neurological:     Mental Status: She is alert and oriented to person, place, and time.     Cranial Nerves: No cranial nerve deficit.     Motor: No abnormal muscle tone.     Coordination: Coordination normal.     Deep Tendon Reflexes: Reflexes are normal and symmetric. Reflexes normal.  Psychiatric:        Behavior: Behavior normal.        Thought Content: Thought content normal.        Judgment: Judgment normal.           Assessment & Plan:  Her HTN and anxiety are well controlled. We will get fasting labs to check lipids and an A1c so we can follow her diabetes. She has developed a neuropathy in the feet, and the diabetes  is the most likely cause of this. We will check a B12 level today to be complete. We will check a thyroid panel. She gets yearly mammograms and eye exams. We spent a total of ( 33  ) minutes reviewing records and discussing these issues.  Alysia Penna, MD

## 2022-02-03 ENCOUNTER — Other Ambulatory Visit: Payer: Self-pay

## 2022-02-03 DIAGNOSIS — E559 Vitamin D deficiency, unspecified: Secondary | ICD-10-CM

## 2022-02-03 MED ORDER — VITAMIN D (ERGOCALCIFEROL) 1.25 MG (50000 UNIT) PO CAPS: 50000.0000 [IU] | ORAL_CAPSULE | ORAL | 3 refills | Status: DC

## 2022-02-13 ENCOUNTER — Other Ambulatory Visit: Payer: Self-pay | Admitting: Family Medicine

## 2022-02-13 DIAGNOSIS — I10 Essential (primary) hypertension: Secondary | ICD-10-CM

## 2022-02-13 DIAGNOSIS — E782 Mixed hyperlipidemia: Secondary | ICD-10-CM

## 2022-03-02 DIAGNOSIS — D225 Melanocytic nevi of trunk: Secondary | ICD-10-CM | POA: Diagnosis not present

## 2022-03-02 DIAGNOSIS — L820 Inflamed seborrheic keratosis: Secondary | ICD-10-CM | POA: Diagnosis not present

## 2022-03-02 DIAGNOSIS — L720 Epidermal cyst: Secondary | ICD-10-CM | POA: Diagnosis not present

## 2022-03-02 DIAGNOSIS — D1801 Hemangioma of skin and subcutaneous tissue: Secondary | ICD-10-CM | POA: Diagnosis not present

## 2022-03-02 DIAGNOSIS — L570 Actinic keratosis: Secondary | ICD-10-CM | POA: Diagnosis not present

## 2022-03-02 DIAGNOSIS — L821 Other seborrheic keratosis: Secondary | ICD-10-CM | POA: Diagnosis not present

## 2022-03-02 DIAGNOSIS — Z85828 Personal history of other malignant neoplasm of skin: Secondary | ICD-10-CM | POA: Diagnosis not present

## 2022-03-02 DIAGNOSIS — D2271 Melanocytic nevi of right lower limb, including hip: Secondary | ICD-10-CM | POA: Diagnosis not present

## 2022-03-19 ENCOUNTER — Other Ambulatory Visit: Payer: Self-pay | Admitting: Family Medicine

## 2022-03-19 DIAGNOSIS — E118 Type 2 diabetes mellitus with unspecified complications: Secondary | ICD-10-CM

## 2022-04-17 ENCOUNTER — Telehealth: Payer: Self-pay | Admitting: Family Medicine

## 2022-04-17 DIAGNOSIS — R300 Dysuria: Secondary | ICD-10-CM

## 2022-04-17 NOTE — Telephone Encounter (Signed)
Pt is calling and would like to know if md would order urine test only. Pt is having urgency to urination started on Saturday. Pt is aware she may need to be seen and there is no opening at our location. I did offer the pt another location

## 2022-04-17 NOTE — Telephone Encounter (Signed)
Please advise 

## 2022-04-17 NOTE — Telephone Encounter (Signed)
Pt called to FU on request for lab order.  It is now 4:18 pm, and the lab closes at 4:30 pm. Pt would like to be able to stop in first thing in the morning, before taking her husband to the Cancer center, where he has an appt is at 10:15 am.  Please advise.

## 2022-04-18 ENCOUNTER — Other Ambulatory Visit (INDEPENDENT_AMBULATORY_CARE_PROVIDER_SITE_OTHER): Payer: Medicare HMO

## 2022-04-18 DIAGNOSIS — R300 Dysuria: Secondary | ICD-10-CM

## 2022-04-18 NOTE — Telephone Encounter (Signed)
I ordered the UA. She can just leave the sample

## 2022-04-18 NOTE — Telephone Encounter (Signed)
Left detailed message for pt to come drop off urine at the office per Dr Sarajane Jews advise

## 2022-04-19 LAB — URINALYSIS, ROUTINE W REFLEX MICROSCOPIC
Bilirubin Urine: NEGATIVE
Ketones, ur: NEGATIVE
Nitrite: NEGATIVE
Specific Gravity, Urine: <=1.005 — AB (ref 1.000–1.030)
Total Protein, Urine: NEGATIVE
Urine Glucose: NEGATIVE
Urobilinogen, UA: 0.2 (ref 0.0–1.0)
pH: 5.5 (ref 5.0–8.0)

## 2022-04-19 NOTE — Telephone Encounter (Signed)
Pt is calling and would like urine results

## 2022-04-21 ENCOUNTER — Other Ambulatory Visit: Payer: Self-pay

## 2022-04-21 ENCOUNTER — Telehealth: Payer: Self-pay

## 2022-04-21 MED ORDER — CIPROFLOXACIN HCL 500 MG PO TABS: 500.0000 mg | ORAL_TABLET | Freq: Two times a day (BID) | ORAL | 0 refills | Status: AC

## 2022-04-21 NOTE — Telephone Encounter (Signed)
Pt Rx for Cipro was sent into pt pharmacy, pt notified

## 2022-04-21 NOTE — Telephone Encounter (Signed)
Call in Cipro 500 mg BID for 7 days  

## 2022-04-21 NOTE — Telephone Encounter (Signed)
Done and pt notified. 

## 2022-04-21 NOTE — Telephone Encounter (Addendum)
Pt called to say she has been waiting for a prescription to treat her UTI for several days now and really needs something sent ASAP to:   [facility] [id number] [place], [place] - [address] Phone: [phone]  Fax: [phone]     Pt was reminded that MD was out of the office this week.  Pt is very worried she will go the entire weekend without some sort of medication.  Please advise.

## 2022-06-15 ENCOUNTER — Other Ambulatory Visit: Payer: Self-pay | Admitting: Family Medicine

## 2022-06-15 DIAGNOSIS — E118 Type 2 diabetes mellitus with unspecified complications: Secondary | ICD-10-CM

## 2022-07-17 DIAGNOSIS — L570 Actinic keratosis: Secondary | ICD-10-CM | POA: Diagnosis not present

## 2022-07-17 DIAGNOSIS — L820 Inflamed seborrheic keratosis: Secondary | ICD-10-CM | POA: Diagnosis not present

## 2022-08-01 ENCOUNTER — Other Ambulatory Visit: Payer: Self-pay | Admitting: Family Medicine

## 2022-08-01 DIAGNOSIS — E118 Type 2 diabetes mellitus with unspecified complications: Secondary | ICD-10-CM

## 2022-08-10 ENCOUNTER — Other Ambulatory Visit: Payer: Self-pay | Admitting: Family Medicine

## 2022-08-10 DIAGNOSIS — E782 Mixed hyperlipidemia: Secondary | ICD-10-CM

## 2022-08-10 DIAGNOSIS — I10 Essential (primary) hypertension: Secondary | ICD-10-CM

## 2022-08-30 ENCOUNTER — Telehealth: Payer: Self-pay | Admitting: Family Medicine

## 2022-08-30 NOTE — Telephone Encounter (Signed)
Contacted Gabriella Farley to schedule their annual wellness visit. Patient declined to schedule AWV at this time.  [name] AWV direct phone # [phone]   Spoke with patient to schedule AWV.  She stated she sees her pcp yearly and did not think this was nescessary.  DO NOT CALL

## 2022-10-09 DIAGNOSIS — H2513 Age-related nuclear cataract, bilateral: Secondary | ICD-10-CM | POA: Diagnosis not present

## 2022-10-09 DIAGNOSIS — E119 Type 2 diabetes mellitus without complications: Secondary | ICD-10-CM | POA: Diagnosis not present

## 2022-10-09 DIAGNOSIS — H5213 Myopia, bilateral: Secondary | ICD-10-CM | POA: Diagnosis not present

## 2022-10-09 LAB — HM DIABETES EYE EXAM

## 2022-10-09 LAB — HM MAMMOGRAPHY

## 2022-11-29 ENCOUNTER — Other Ambulatory Visit: Payer: Self-pay | Admitting: Family Medicine

## 2022-11-29 DIAGNOSIS — E782 Mixed hyperlipidemia: Secondary | ICD-10-CM

## 2022-11-29 DIAGNOSIS — E118 Type 2 diabetes mellitus with unspecified complications: Secondary | ICD-10-CM

## 2022-12-18 ENCOUNTER — Telehealth: Payer: Self-pay

## 2022-12-18 NOTE — Telephone Encounter (Signed)
LVM for patient to call back [phone], or to call PCP office to schedule follow up apt. AS, CMA  

## 2022-12-19 DIAGNOSIS — H2511 Age-related nuclear cataract, right eye: Secondary | ICD-10-CM | POA: Diagnosis not present

## 2022-12-19 DIAGNOSIS — H25811 Combined forms of age-related cataract, right eye: Secondary | ICD-10-CM | POA: Diagnosis not present

## 2023-01-02 DIAGNOSIS — H2512 Age-related nuclear cataract, left eye: Secondary | ICD-10-CM | POA: Diagnosis not present

## 2023-01-02 DIAGNOSIS — H25812 Combined forms of age-related cataract, left eye: Secondary | ICD-10-CM | POA: Diagnosis not present

## 2023-01-02 DIAGNOSIS — Z961 Presence of intraocular lens: Secondary | ICD-10-CM | POA: Diagnosis not present

## 2023-02-09 ENCOUNTER — Encounter: Payer: Self-pay | Admitting: Family Medicine

## 2023-02-09 ENCOUNTER — Other Ambulatory Visit: Payer: Self-pay

## 2023-02-09 ENCOUNTER — Ambulatory Visit (INDEPENDENT_AMBULATORY_CARE_PROVIDER_SITE_OTHER): Payer: Medicare HMO | Admitting: Family Medicine

## 2023-02-09 VITALS — BP 124/64 | HR 60 | Temp 98.4°F | Ht 64.0 in | Wt 149.4 lb

## 2023-02-09 DIAGNOSIS — E782 Mixed hyperlipidemia: Secondary | ICD-10-CM

## 2023-02-09 DIAGNOSIS — E538 Deficiency of other specified B group vitamins: Secondary | ICD-10-CM

## 2023-02-09 DIAGNOSIS — E559 Vitamin D deficiency, unspecified: Secondary | ICD-10-CM

## 2023-02-09 DIAGNOSIS — E118 Type 2 diabetes mellitus with unspecified complications: Secondary | ICD-10-CM

## 2023-02-09 DIAGNOSIS — E039 Hypothyroidism, unspecified: Secondary | ICD-10-CM

## 2023-02-09 DIAGNOSIS — I10 Essential (primary) hypertension: Secondary | ICD-10-CM | POA: Diagnosis not present

## 2023-02-09 DIAGNOSIS — G629 Polyneuropathy, unspecified: Secondary | ICD-10-CM | POA: Insufficient documentation

## 2023-02-09 DIAGNOSIS — H524 Presbyopia: Secondary | ICD-10-CM | POA: Diagnosis not present

## 2023-02-09 DIAGNOSIS — H52223 Regular astigmatism, bilateral: Secondary | ICD-10-CM | POA: Diagnosis not present

## 2023-02-09 DIAGNOSIS — F411 Generalized anxiety disorder: Secondary | ICD-10-CM

## 2023-02-09 LAB — CBC WITH DIFFERENTIAL/PLATELET
Basophils Absolute: 0.1 10*3/uL (ref 0.0–0.1)
Basophils Relative: 0.9 % (ref 0.0–3.0)
Eosinophils Absolute: 0.2 10*3/uL (ref 0.0–0.7)
Eosinophils Relative: 1.8 % (ref 0.0–5.0)
HCT: 39.3 % (ref 36.0–46.0)
Hemoglobin: 12.9 g/dL (ref 12.0–15.0)
Lymphocytes Relative: 27.8 % (ref 12.0–46.0)
Lymphs Abs: 2.7 10*3/uL (ref 0.7–4.0)
MCHC: 32.9 g/dL (ref 30.0–36.0)
MCV: 85.3 fl (ref 78.0–100.0)
Monocytes Absolute: 0.8 10*3/uL (ref 0.1–1.0)
Monocytes Relative: 8 % (ref 3.0–12.0)
Neutro Abs: 5.9 10*3/uL (ref 1.4–7.7)
Neutrophils Relative %: 61.5 % (ref 43.0–77.0)
Platelets: 224 10*3/uL (ref 150.0–400.0)
RBC: 4.6 Mil/uL (ref 3.87–5.11)
RDW: 14.6 % (ref 11.5–15.5)
WBC: 9.6 10*3/uL (ref 4.0–10.5)

## 2023-02-09 LAB — HEPATIC FUNCTION PANEL
ALT: 11 U/L (ref 0–35)
AST: 15 U/L (ref 0–37)
Albumin: 4 g/dL (ref 3.5–5.2)
Alkaline Phosphatase: 67 U/L (ref 39–117)
Bilirubin, Direct: 0.1 mg/dL (ref 0.0–0.3)
Total Bilirubin: 0.7 mg/dL (ref 0.2–1.2)
Total Protein: 7.2 g/dL (ref 6.0–8.3)

## 2023-02-09 LAB — BASIC METABOLIC PANEL
BUN: 14 mg/dL (ref 6–23)
CO2: 29 mEq/L (ref 19–32)
Calcium: 9 mg/dL (ref 8.4–10.5)
Chloride: 102 mEq/L (ref 96–112)
Creatinine, Ser: 0.89 mg/dL (ref 0.40–1.20)
GFR: 63.63 mL/min (ref >60.00)
Glucose, Bld: 118 mg/dL — ABNORMAL HIGH (ref 70–99)
Potassium: 4.1 mEq/L (ref 3.5–5.1)
Sodium: 139 mEq/L (ref 135–145)

## 2023-02-09 LAB — LIPID PANEL
Cholesterol: 133 mg/dL (ref 0–200)
HDL: 50.7 mg/dL (ref >39.00)
LDL Cholesterol: 49 mg/dL (ref 0–99)
NonHDL: 82.76
Total CHOL/HDL Ratio: 3
Triglycerides: 167 mg/dL — ABNORMAL HIGH (ref 0.0–149.0)
VLDL: 33.4 mg/dL (ref 0.0–40.0)

## 2023-02-09 LAB — VITAMIN B12: Vitamin B-12: 183 pg/mL — ABNORMAL LOW (ref 211–911)

## 2023-02-09 LAB — VITAMIN D 25 HYDROXY (VIT D DEFICIENCY, FRACTURES): VITD: 58.54 ng/mL (ref 30.00–100.00)

## 2023-02-09 LAB — T3, FREE: T3, Free: 3 pg/mL (ref 2.3–4.2)

## 2023-02-09 LAB — HEMOGLOBIN A1C: Hgb A1c MFr Bld: 6.7 % — ABNORMAL HIGH (ref 4.6–6.5)

## 2023-02-09 LAB — T4, FREE: Free T4: 1.31 ng/dL (ref 0.60–1.60)

## 2023-02-09 LAB — TSH: TSH: 0.49 u[IU]/mL (ref 0.35–5.50)

## 2023-02-09 MED ORDER — DICLOFENAC SODIUM 75 MG PO TBEC: 75.0000 mg | DELAYED_RELEASE_TABLET | Freq: Two times a day (BID) | ORAL | 3 refills | Status: DC

## 2023-02-09 MED ORDER — LEVOTHYROXINE SODIUM 75 MCG PO TABS: 75.0000 ug | ORAL_TABLET | Freq: Every day | ORAL | 3 refills | Status: DC

## 2023-02-09 MED ORDER — VITAMIN D (ERGOCALCIFEROL) 1.25 MG (50000 UNIT) PO CAPS
50000.0000 [IU] | ORAL_CAPSULE | ORAL | 3 refills | Status: DC
Start: 2023-02-09 — End: 2024-02-11

## 2023-02-09 MED ORDER — ROSUVASTATIN CALCIUM 10 MG PO TABS
10.0000 mg | ORAL_TABLET | Freq: Every day | ORAL | 3 refills | Status: DC
Start: 2023-02-09 — End: 2024-02-11

## 2023-02-09 MED ORDER — GABAPENTIN 100 MG PO CAPS: 100.0000 mg | ORAL_CAPSULE | Freq: Every day | ORAL | 3 refills | Status: DC

## 2023-02-09 MED ORDER — AMLODIPINE BESYLATE 5 MG PO TABS: 5.0000 mg | ORAL_TABLET | Freq: Every day | ORAL | 3 refills | Status: DC

## 2023-02-09 NOTE — Progress Notes (Signed)
Subjective:    Patient ID: Gabriella Farley, female    DOB: 18-Apr-1946, 77 y.o.   MRN: [id number]  HPI Here to follow up on issues. Her BP is stable. Her anxiety is stable. She describes tingling and mild burning pains in her feet and lower legs at night. This started a few months ago. These interrupt her sleep, but she does not feel them during the daytime. Her weight is stable. She recently had cataract surgery. Her OA is stable.    Review of Systems  Constitutional: Negative.   HENT: Negative.    Eyes: Negative.   Respiratory: Negative.    Cardiovascular: Negative.   Gastrointestinal: Negative.   Genitourinary:  Negative for decreased urine volume, difficulty urinating, dyspareunia, dysuria, enuresis, flank pain, frequency, hematuria, pelvic pain and urgency.  Musculoskeletal: Negative.   Skin: Negative.   Neurological:  Positive for numbness. Negative for headaches.  Psychiatric/Behavioral: Negative.         Objective:   Physical Exam Constitutional:      General: She is not in acute distress.    Appearance: She is well-developed.  HENT:     Head: Normocephalic and atraumatic.     Right Ear: External ear normal.     Left Ear: External ear normal.     Nose: Nose normal.     Mouth/Throat:     Pharynx: No oropharyngeal exudate.  Eyes:     General: No scleral icterus.    Conjunctiva/sclera: Conjunctivae normal.     Pupils: Pupils are equal, round, and reactive to light.  Neck:     Thyroid: No thyromegaly.     Vascular: No JVD.  Cardiovascular:     Rate and Rhythm: Normal rate and regular rhythm.     Pulses: Normal pulses.     Heart sounds: Normal heart sounds. No murmur heard.    No friction rub. No gallop.  Pulmonary:     Effort: Pulmonary effort is normal. No respiratory distress.     Breath sounds: Normal breath sounds. No wheezing or rales.  Chest:     Chest wall: No tenderness.  Abdominal:     General: Bowel sounds are normal. There is no distension.      Palpations: Abdomen is soft. There is no mass.     Tenderness: There is no abdominal tenderness. There is no guarding or rebound.  Musculoskeletal:        General: No tenderness. Normal range of motion.     Cervical back: Normal range of motion and neck supple.     Right lower leg: No edema.     Left lower leg: No edema.  Lymphadenopathy:     Cervical: No cervical adenopathy.  Skin:    General: Skin is warm and dry.     Findings: No erythema or rash.  Neurological:     Mental Status: She is alert and oriented to person, place, and time. Mental status is at baseline.     Cranial Nerves: No cranial nerve deficit.     Motor: No abnormal muscle tone.     Coordination: Coordination normal.     Deep Tendon Reflexes: Reflexes are normal and symmetric. Reflexes normal.  Psychiatric:        Behavior: Behavior normal.        Thought Content: Thought content normal.        Judgment: Judgment normal.           Assessment & Plan:  Her anxiety and OA are stable.  Her HTN is well controlled. She has developed some neuropathy, and this is most likely the result of diabetes. We will check a B12 level today however. She will try Gabapentin 100 mg at bedtime. Get fasting labs for thyroid levels, lipids, A1c, etc. We spent a total of ( 35  ) minutes reviewing records and discussing these issues.  Gershon Crane, MD

## 2023-02-20 ENCOUNTER — Ambulatory Visit: Payer: Medicare HMO

## 2023-03-18 ENCOUNTER — Other Ambulatory Visit: Payer: Self-pay | Admitting: Family Medicine

## 2023-03-18 DIAGNOSIS — E118 Type 2 diabetes mellitus with unspecified complications: Secondary | ICD-10-CM

## 2023-06-11 ENCOUNTER — Other Ambulatory Visit: Payer: Self-pay | Admitting: Family Medicine

## 2023-06-11 MED ORDER — GABAPENTIN 100 MG PO CAPS: 100.0000 mg | ORAL_CAPSULE | Freq: Every day | ORAL | 1 refills | Status: DC

## 2023-06-11 NOTE — Telephone Encounter (Signed)
Copied from CRM [id number]. Topic: Clinical - Medication Refill >> Jun 11, 2023  9:31 AM Elizebeth Brooking wrote: Most Recent Primary Care Visit:  Provider: Gershon Crane A  Department: [facility]  Visit Type: PHYSICAL  Date: 02/09/2023  Medication: ***  Has the patient contacted their pharmacy?  (Agent: If no, request that the patient contact the pharmacy for the refill. If patient does not wish to contact the pharmacy document the reason why and proceed with request.) (Agent: If yes, when and what did the pharmacy advise?)  Is this the correct pharmacy for this prescription?  If no, delete pharmacy and type the correct one.  This is the patient's preferred pharmacy:  [facility] [id number] [place], [place] - [address] [place] [address] Phone: [phone] Fax: [phone]   Has the prescription been filled recently?   Is the patient out of the medication?   Has the patient been seen for an appointment in the last year OR does the patient have an upcoming appointment?   Can we respond through MyChart?   Agent: Please be advised that Rx refills may take up to 3 business days. We ask that you follow-up with your pharmacy.

## 2023-07-07 ENCOUNTER — Other Ambulatory Visit: Payer: Self-pay | Admitting: Family Medicine

## 2023-07-07 DIAGNOSIS — E118 Type 2 diabetes mellitus with unspecified complications: Secondary | ICD-10-CM

## 2023-08-09 ENCOUNTER — Ambulatory Visit: Admitting: Family Medicine

## 2023-08-09 ENCOUNTER — Encounter: Payer: Self-pay | Admitting: Family Medicine

## 2023-08-09 VITALS — BP 156/68 | HR 69 | Temp 98.0°F | Ht 64.0 in | Wt 151.0 lb

## 2023-08-09 DIAGNOSIS — Z7984 Long term (current) use of oral hypoglycemic drugs: Secondary | ICD-10-CM | POA: Diagnosis not present

## 2023-08-09 DIAGNOSIS — L659 Nonscarring hair loss, unspecified: Secondary | ICD-10-CM | POA: Diagnosis not present

## 2023-08-09 DIAGNOSIS — I10 Essential (primary) hypertension: Secondary | ICD-10-CM

## 2023-08-09 DIAGNOSIS — E118 Type 2 diabetes mellitus with unspecified complications: Secondary | ICD-10-CM | POA: Diagnosis not present

## 2023-08-09 DIAGNOSIS — R519 Headache, unspecified: Secondary | ICD-10-CM

## 2023-08-09 DIAGNOSIS — E039 Hypothyroidism, unspecified: Secondary | ICD-10-CM | POA: Diagnosis not present

## 2023-08-09 LAB — HEMOGLOBIN A1C: Hgb A1c MFr Bld: 6.8 % — ABNORMAL HIGH (ref 4.6–6.5)

## 2023-08-09 LAB — BASIC METABOLIC PANEL
BUN: 11 mg/dL (ref 6–23)
CO2: 28 meq/L (ref 19–32)
Calcium: 9.3 mg/dL (ref 8.4–10.5)
Chloride: 102 meq/L (ref 96–112)
Creatinine, Ser: 0.89 mg/dL (ref 0.40–1.20)
GFR: 63.41 mL/min (ref >60.00)
Glucose, Bld: 88 mg/dL (ref 70–99)
Potassium: 3.9 meq/L (ref 3.5–5.1)
Sodium: 139 meq/L (ref 135–145)

## 2023-08-09 LAB — TSH: TSH: 1.5 u[IU]/mL (ref 0.35–5.50)

## 2023-08-09 MED ORDER — AMLODIPINE BESYLATE 5 MG PO TABS
7.5000 mg | ORAL_TABLET | Freq: Every day | ORAL | 0 refills | Status: AC
Start: 2023-08-09 — End: ?

## 2023-08-09 NOTE — Progress Notes (Signed)
 Established Patient Office Visit   Subjective  Patient ID: Gabriella Farley, female    DOB: 11-23-1945  Age: 78 y.o. MRN: [id number]  Chief Complaint  Patient presents with   Medical Management of Chronic Issues    High BP 163/60 at home, headaches, and hair loss     Patient is a 78 year old female followed with Dr. Clent Ridges and seen for ongoing concern.  Patient with elevation in BP, 163/60 last night.  Pt had [employer] pizza, Dr. Reino Kent last night.  Does not drink much water.  May have a few sips with medications.  Patient endorses waking up with headaches for the last few weeks.  Sometimes has a tingling sensation in the occipital area otherwise of pain and frontal area or middle of head.  Patient also endorses hair shedding/falling out for the last 3 months.  Last TSH check in September.  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  Patient requesting A1c.     Patient Active Problem List   Diagnosis Date Noted   Neuropathy 02/09/2023   Controlled diabetes mellitus type 2 with complications (HCC) 11/23/2017   Hypothyroidism 02/26/2007   Hyperlipemia, mixed 02/26/2007   Anxiety state 02/26/2007   Essential hypertension 02/26/2007   Past Medical History:  Diagnosis Date   Allergy    slight allergy to pollen   Anxiety    no per pt   Cataract    Colon polyps    DJD (degenerative joint disease)    Gallstones    Hyperlipemia    Hypertension    Hypothyroidism    Migraines    none since BP meds 43yrs ago per pt   Visual floaters    retinal   sees Dr Cecilie Kicks   Past Surgical History:  Procedure Laterality Date   childbirth      x 2   COLONOSCOPY  10/11/2021   per Dr. Rhea Belton, benign polyps and rectal inflammation, repeat sigmoidoscopy in 3 months   FLEXIBLE SIGMOIDOSCOPY  01/16/2022   per Dr. Rhea Belton, normal rectal mucosa, no repeats are needed   SIGMOIDOSCOPY  01/16/2022   per Dr. Rhea Belton, clear, no further scopes needed    TONSILLECTOMY     TUBAL LIGATION     Social History   Tobacco Use   Smoking status: Never   Smokeless tobacco: Never  Vaping Use   Vaping status: Never Used  Substance Use Topics   Alcohol use: No    Alcohol/week: 0.0 standard drinks of alcohol   Drug use: No   Family History  Problem Relation Age of Onset   Breast cancer Paternal Aunt 43   Breast cancer Cousin        paternal   Alcohol abuse Other    Diabetes Other    Hyperlipidemia Other    Hypertension Other    Stroke Other    Other Other        respiratory disease/weight disorder   Colon cancer Neg Hx    Esophageal cancer Neg Hx    Rectal cancer Neg Hx    Ulcerative colitis Neg Hx    Stomach cancer Neg Hx    Allergies  Allergen Reactions   Penicillins Shortness Of Breath   Codeine     Sick to stomach   Sulfonamide Derivatives     welts      ROS Negative unless stated above    Objective:     BP (!) 154/62 (BP  Location: Left Arm, Patient Position: Sitting, Cuff Size: Normal)   Pulse 69   Temp 98 F (36.7 C) (Oral)   Ht 5\' 4"  (1.626 m)   Wt 151 lb (68.5 kg)   SpO2 96%   BMI 25.92 kg/m  BP Readings from Last 3 Encounters:  08/09/23 (!) 154/62  02/09/23 124/64  02/01/22 124/68   Wt Readings from Last 3 Encounters:  08/09/23 151 lb (68.5 kg)  02/09/23 149 lb 6.4 oz (67.8 kg)  02/01/22 147 lb 2 oz (66.7 kg)      Physical Exam Constitutional:      General: She is not in acute distress.    Appearance: Normal appearance.  HENT:     Head: Normocephalic and atraumatic.     Nose: Nose normal.     Mouth/Throat:     Mouth: Mucous membranes are moist.  Cardiovascular:     Rate and Rhythm: Normal rate and regular rhythm.     Heart sounds: Normal heart sounds. No murmur heard.    No gallop.  Pulmonary:     Effort: Pulmonary effort is normal. No respiratory distress.     Breath sounds: Normal breath sounds. No wheezing, rhonchi or rales.  Skin:    General: Skin is warm and dry.  Neurological:      Mental Status: She is alert and oriented to person, place, and time.      No results found for any visits on 08/09/23.    Assessment & Plan:  Essential hypertension -     amLODIPine Besylate; Take 1.5 tablets (7.5 mg total) by mouth daily.  Dispense: 135 tablet; Refill: 0 -     Basic metabolic panel  Controlled type 2 diabetes mellitus with complication, without long-term current use of insulin (HCC) -     Hemoglobin A1c  Hypothyroidism, unspecified type -     TSH  Acute nonintractable headache, unspecified headache type  Hair loss  Pt seen for acute BP elevation.  Discussed possible causes including thyroid dysfunction, dehydration, increased sodium intake, decreased sleep, and stress.  BP elevation likely also contributing to headaches though dehydration, insomnia, increased stress can also contribute.  Discussed increasing Norvasc.  Patient initially hesitant.  Will increase Norvasc from 5 mg to 7.5 mg daily.  Patient also advised to increase intake of water.  Recheck TSH.  For now continue Synthroid 75 mcg.  Continue other medications.  Will have monitor BP consistently at home for the next few weeks and bring readings to next appointment with PCP.  Return in about 3 weeks (around 08/30/2023) for blood pressure.   Deeann Saint, MD

## 2023-08-09 NOTE — Patient Instructions (Signed)
 Follow-up with your PCP in the next 2 to 3 weeks.

## 2023-08-15 ENCOUNTER — Encounter: Payer: Self-pay | Admitting: Family Medicine

## 2023-08-24 IMAGING — MG MM DIGITAL SCREENING BILAT W/ TOMO AND CAD
8 series · 9 of 24 positions shown · non-contrast
Comparison: Previous exam(s).

CLINICAL DATA: Screening.

EXAM:
DIGITAL SCREENING BILATERAL MAMMOGRAM WITH TOMOSYNTHESIS AND CAD
TECHNIQUE: Bilateral screening digital craniocaudal and mediolateral oblique
mammograms were obtained. Bilateral screening digital breast
tomosynthesis was performed. The images were evaluated with
computer-aided detection.

[L CC synth-2D]
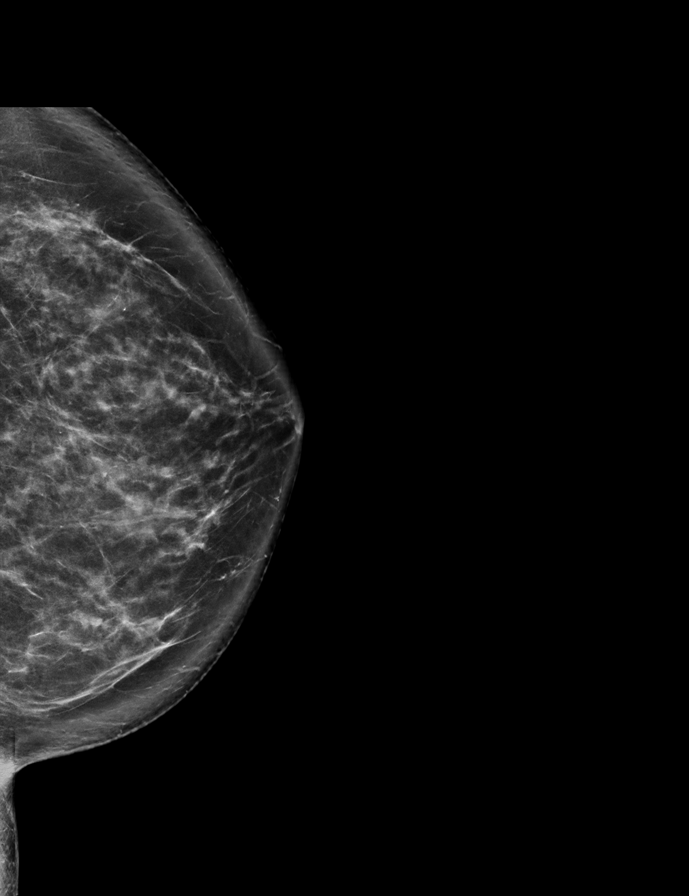

[R MLO synth-2D]
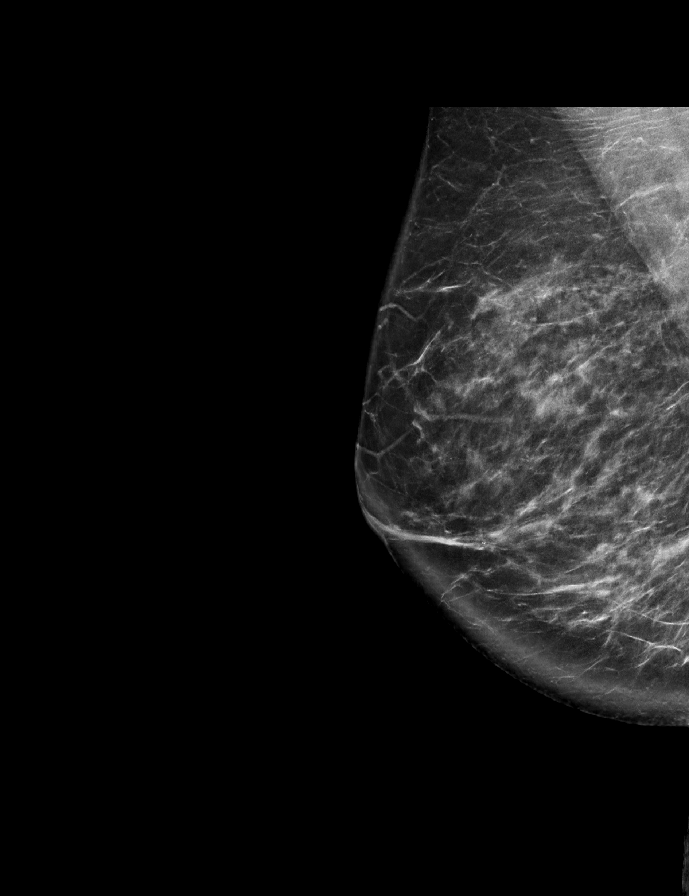

[L MLO synth-2D]
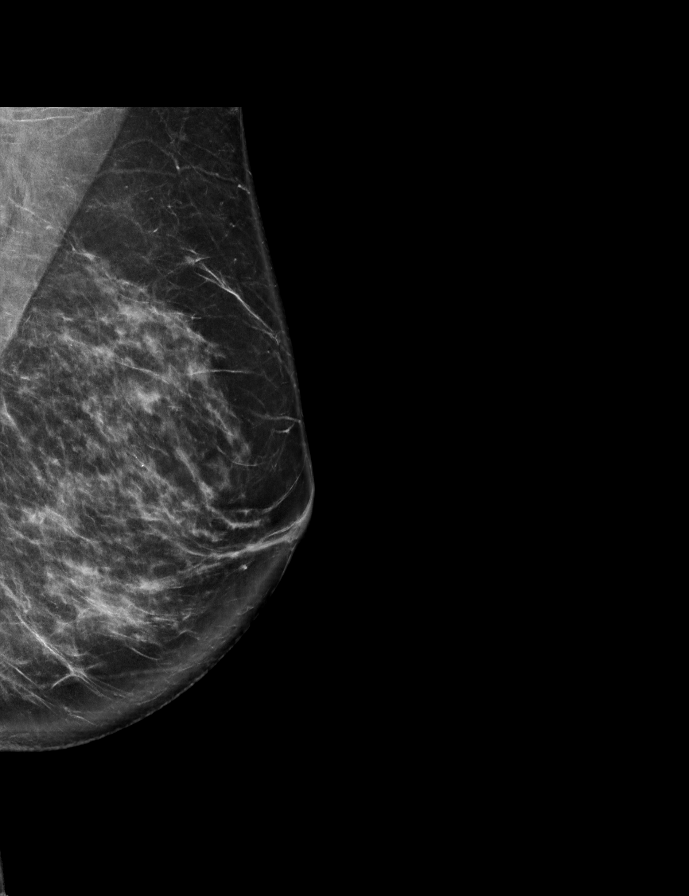

[R CC synth-2D]
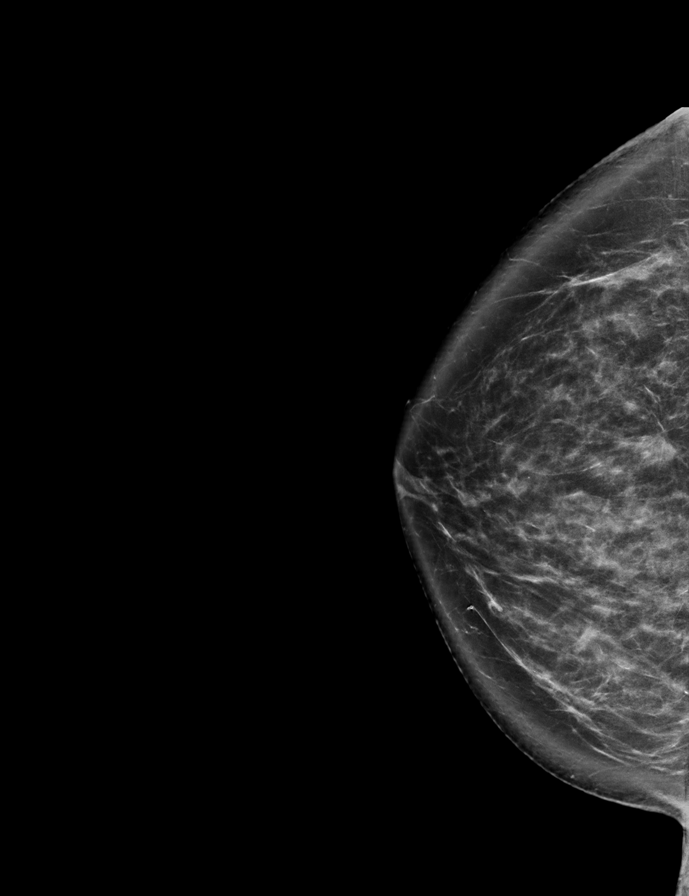

[L MLO tomo · 2 of 84 frames shown]
[frame 28/84]
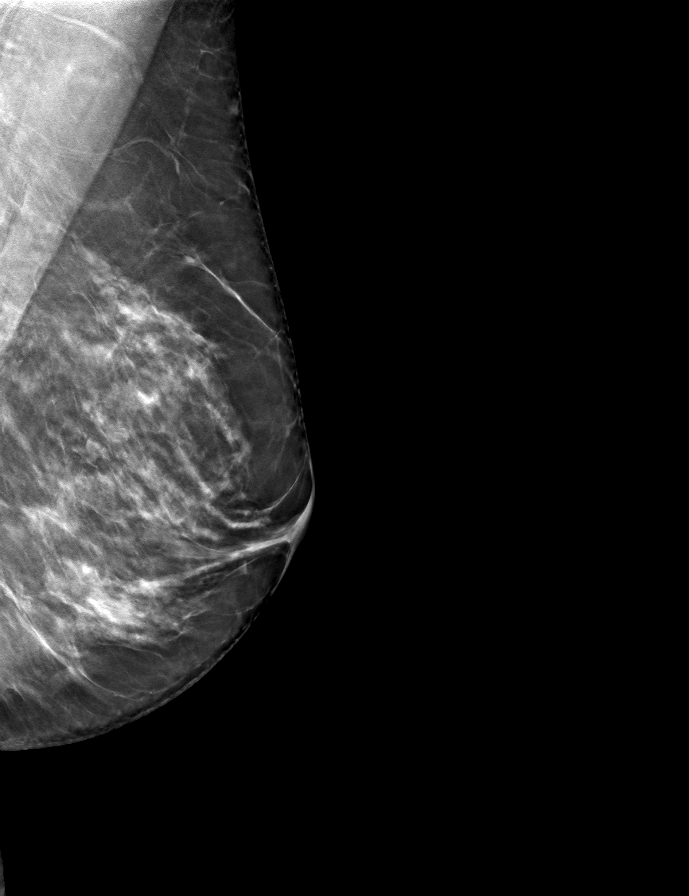
[frame 43/84]
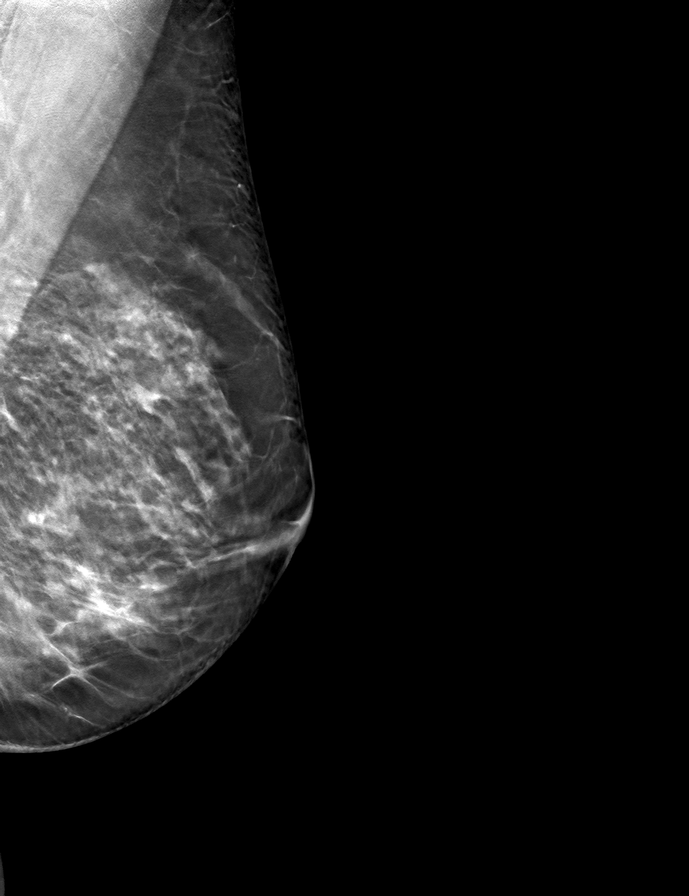

[R CC tomo · tomo slice 43/85.0]
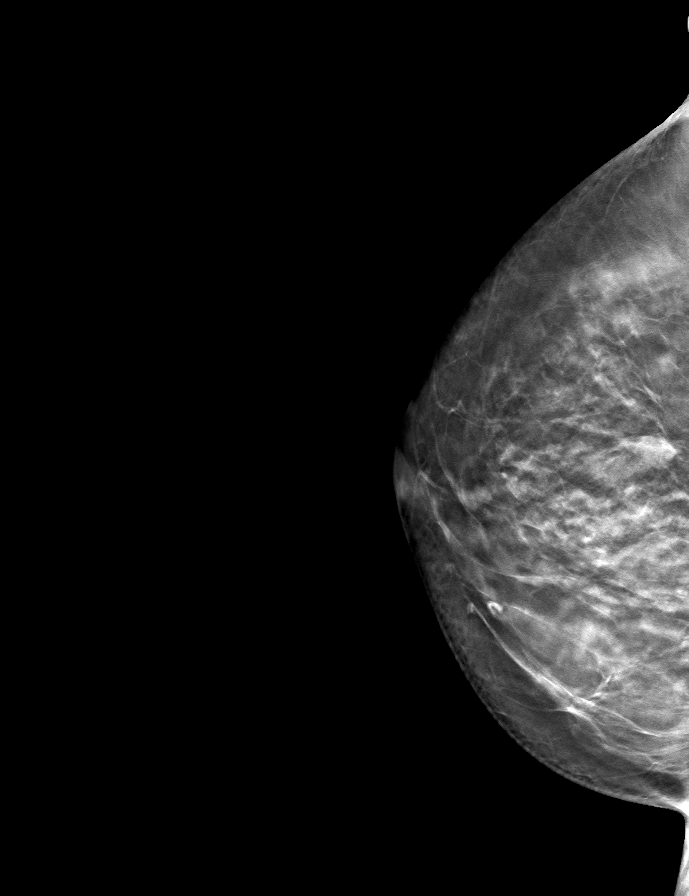

[R MLO tomo · tomo slice 45/88.0]
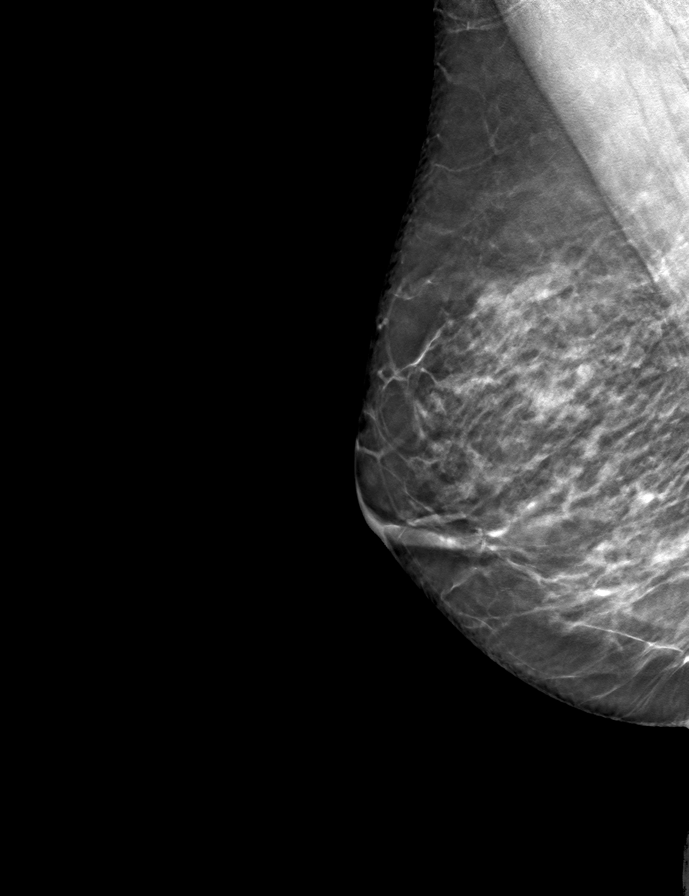

[L CC tomo · tomo slice 41/82.0]
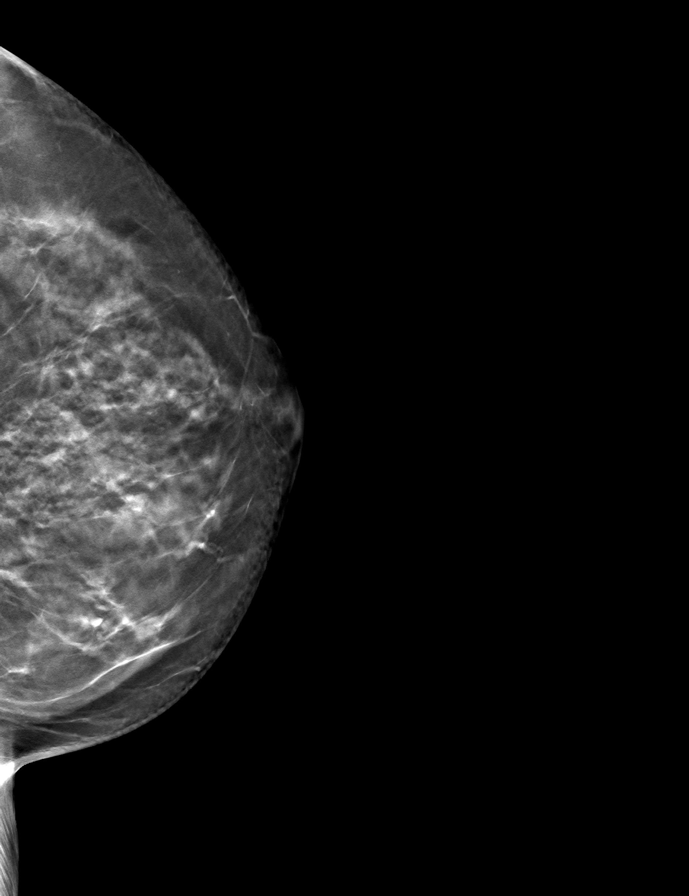

[9 of 24 positions shown; findings below may reference images not displayed]

ACR Breast Density Category b: There are scattered areas of
fibroglandular density.
FINDINGS: In the right breast, a possible asymmetry warrants further
evaluation. In the left breast, no findings suspicious for
malignancy.
IMPRESSION: Further evaluation is suggested for possible asymmetry in the right
breast.

RECOMMENDATION:
Diagnostic mammogram and possibly ultrasound of the right breast.
(Code:BU-C-IIK)

The patient will be contacted regarding the findings, and additional
imaging will be scheduled.

BI-RADS CATEGORY  0: Incomplete. Need additional imaging evaluation
and/or prior mammograms for comparison.

## 2023-09-15 ENCOUNTER — Other Ambulatory Visit: Payer: Self-pay | Admitting: Family Medicine

## 2023-09-15 DIAGNOSIS — E118 Type 2 diabetes mellitus with unspecified complications: Secondary | ICD-10-CM

## 2023-11-06 ENCOUNTER — Other Ambulatory Visit: Payer: Self-pay | Admitting: Family Medicine

## 2023-11-06 DIAGNOSIS — I10 Essential (primary) hypertension: Secondary | ICD-10-CM

## 2023-11-06 MED ORDER — AMLODIPINE BESYLATE 5 MG PO TABS: 7.5000 mg | ORAL_TABLET | Freq: Every day | ORAL | 0 refills | Status: DC

## 2023-11-06 NOTE — Telephone Encounter (Signed)
 Copied from CRM [id number]. Topic: Clinical - Medication Refill >> Nov 06, 2023  8:01 AM Earnestine Goes B wrote: Medication: amLODipine  (NORVASC ) 5 MG tablet  Has the patient contacted their pharmacy? Yes (Agent: If no, request that the patient contact the pharmacy for the refill. If patient does not wish to contact the pharmacy document the reason why and proceed with request.) (Agent: If yes, when and what did the pharmacy advise?)  This is the patient's preferred pharmacy:  [facility] [id number] - [place], [place] - [address] [place] [address] Phone: [phone] Fax: [phone]  Is this the correct pharmacy for this prescription? Yes If no, delete pharmacy and type the correct one.   Has the prescription been filled recently? Yes  Is the patient out of the medication? Yes  Has the patient been seen for an appointment in the last year OR does the patient have an upcoming appointment? Yes  Can we respond through MyChart? Yes  Agent: Please be advised that Rx refills may take up to 3 business days. We ask that you follow-up with your pharmacy.

## 2023-11-26 ENCOUNTER — Other Ambulatory Visit: Payer: Self-pay | Admitting: Family Medicine

## 2023-11-26 DIAGNOSIS — Z1231 Encounter for screening mammogram for malignant neoplasm of breast: Secondary | ICD-10-CM

## 2023-12-06 ENCOUNTER — Ambulatory Visit
Admission: RE | Admit: 2023-12-06 | Discharge: 2023-12-06 | Disposition: A | Discharge status: Home / Self Care | Source: Ambulatory Visit | Attending: Family Medicine | Admitting: Family Medicine

## 2023-12-06 DIAGNOSIS — Z1231 Encounter for screening mammogram for malignant neoplasm of breast: Secondary | ICD-10-CM | POA: Diagnosis not present

## 2023-12-08 ENCOUNTER — Other Ambulatory Visit: Payer: Self-pay | Admitting: Family Medicine

## 2023-12-27 DIAGNOSIS — L821 Other seborrheic keratosis: Secondary | ICD-10-CM | POA: Diagnosis not present

## 2023-12-27 DIAGNOSIS — D2262 Melanocytic nevi of left upper limb, including shoulder: Secondary | ICD-10-CM | POA: Diagnosis not present

## 2023-12-27 DIAGNOSIS — D2272 Melanocytic nevi of left lower limb, including hip: Secondary | ICD-10-CM | POA: Diagnosis not present

## 2023-12-27 DIAGNOSIS — Z85828 Personal history of other malignant neoplasm of skin: Secondary | ICD-10-CM | POA: Diagnosis not present

## 2023-12-27 DIAGNOSIS — D225 Melanocytic nevi of trunk: Secondary | ICD-10-CM | POA: Diagnosis not present

## 2023-12-27 DIAGNOSIS — L650 Telogen effluvium: Secondary | ICD-10-CM | POA: Diagnosis not present

## 2023-12-27 DIAGNOSIS — D2261 Melanocytic nevi of right upper limb, including shoulder: Secondary | ICD-10-CM | POA: Diagnosis not present

## 2023-12-27 DIAGNOSIS — D2271 Melanocytic nevi of right lower limb, including hip: Secondary | ICD-10-CM | POA: Diagnosis not present

## 2023-12-27 DIAGNOSIS — L905 Scar conditions and fibrosis of skin: Secondary | ICD-10-CM | POA: Diagnosis not present

## 2024-01-07 ENCOUNTER — Ambulatory Visit (INDEPENDENT_AMBULATORY_CARE_PROVIDER_SITE_OTHER): Admitting: Family Medicine

## 2024-01-07 ENCOUNTER — Encounter: Payer: Self-pay | Admitting: Family Medicine

## 2024-01-07 VITALS — BP 138/60 | HR 63 | Temp 98.1°F | Wt 145.0 lb

## 2024-01-07 DIAGNOSIS — I10 Essential (primary) hypertension: Secondary | ICD-10-CM | POA: Diagnosis not present

## 2024-01-07 DIAGNOSIS — E782 Mixed hyperlipidemia: Secondary | ICD-10-CM

## 2024-01-07 DIAGNOSIS — L659 Nonscarring hair loss, unspecified: Secondary | ICD-10-CM | POA: Insufficient documentation

## 2024-01-07 DIAGNOSIS — E559 Vitamin D deficiency, unspecified: Secondary | ICD-10-CM | POA: Diagnosis not present

## 2024-01-07 DIAGNOSIS — E039 Hypothyroidism, unspecified: Secondary | ICD-10-CM | POA: Diagnosis not present

## 2024-01-07 DIAGNOSIS — E538 Deficiency of other specified B group vitamins: Secondary | ICD-10-CM | POA: Diagnosis not present

## 2024-01-07 DIAGNOSIS — Z7984 Long term (current) use of oral hypoglycemic drugs: Secondary | ICD-10-CM

## 2024-01-07 DIAGNOSIS — E118 Type 2 diabetes mellitus with unspecified complications: Secondary | ICD-10-CM

## 2024-01-07 LAB — LIPID PANEL
Cholesterol: 145 mg/dL (ref 0–200)
HDL: 58.5 mg/dL (ref >39.00)
LDL Cholesterol: 63 mg/dL (ref 0–99)
NonHDL: 86.57
Total CHOL/HDL Ratio: 2
Triglycerides: 119 mg/dL (ref 0.0–149.0)
VLDL: 23.8 mg/dL (ref 0.0–40.0)

## 2024-01-07 LAB — HEPATIC FUNCTION PANEL
ALT: 15 U/L (ref 0–35)
AST: 21 U/L (ref 0–37)
Albumin: 4.3 g/dL (ref 3.5–5.2)
Alkaline Phosphatase: 61 U/L (ref 39–117)
Bilirubin, Direct: 0.1 mg/dL (ref 0.0–0.3)
Total Bilirubin: 0.5 mg/dL (ref 0.2–1.2)
Total Protein: 7.6 g/dL (ref 6.0–8.3)

## 2024-01-07 LAB — HEMOGLOBIN A1C: Hgb A1c MFr Bld: 6.8 % — ABNORMAL HIGH (ref 4.6–6.5)

## 2024-01-07 LAB — CBC WITH DIFFERENTIAL/PLATELET
Basophils Absolute: 0.1 K/uL (ref 0.0–0.1)
Basophils Relative: 1.1 % (ref 0.0–3.0)
Eosinophils Absolute: 0.1 K/uL (ref 0.0–0.7)
Eosinophils Relative: 1.9 % (ref 0.0–5.0)
HCT: 39.8 % (ref 36.0–46.0)
Hemoglobin: 12.9 g/dL (ref 12.0–15.0)
Lymphocytes Relative: 35.2 % (ref 12.0–46.0)
Lymphs Abs: 1.7 K/uL (ref 0.7–4.0)
MCHC: 32.4 g/dL (ref 30.0–36.0)
MCV: 83.1 fl (ref 78.0–100.0)
Monocytes Absolute: 0.4 K/uL (ref 0.1–1.0)
Monocytes Relative: 8.2 % (ref 3.0–12.0)
Neutro Abs: 2.6 K/uL (ref 1.4–7.7)
Neutrophils Relative %: 53.6 % (ref 43.0–77.0)
Platelets: 225 K/uL (ref 150.0–400.0)
RBC: 4.79 Mil/uL (ref 3.87–5.11)
RDW: 15 % (ref 11.5–15.5)
WBC: 4.9 K/uL (ref 4.0–10.5)

## 2024-01-07 LAB — VITAMIN D 25 HYDROXY (VIT D DEFICIENCY, FRACTURES): VITD: 100.31 ng/mL — ABNORMAL HIGH (ref 30.00–100.00)

## 2024-01-07 LAB — BASIC METABOLIC PANEL WITH GFR
BUN: 14 mg/dL (ref 6–23)
CO2: 30 meq/L (ref 19–32)
Calcium: 8.9 mg/dL (ref 8.4–10.5)
Chloride: 101 meq/L (ref 96–112)
Creatinine, Ser: 0.95 mg/dL (ref 0.40–1.20)
GFR: 58.46 mL/min — ABNORMAL LOW (ref >60.00)
Glucose, Bld: 121 mg/dL — ABNORMAL HIGH (ref 70–99)
Potassium: 4.2 meq/L (ref 3.5–5.1)
Sodium: 141 meq/L (ref 135–145)

## 2024-01-07 LAB — T3, FREE: T3, Free: 2.5 pg/mL (ref 2.3–4.2)

## 2024-01-07 LAB — TSH: TSH: 6.54 u[IU]/mL — ABNORMAL HIGH (ref 0.35–5.50)

## 2024-01-07 LAB — T4, FREE: Free T4: 0.89 ng/dL (ref 0.60–1.60)

## 2024-01-07 LAB — VITAMIN B12: Vitamin B-12: 537 pg/mL (ref 211–911)

## 2024-01-07 NOTE — Progress Notes (Signed)
   Subjective:    Patient ID: Gabriella Farley, female    DOB: 1945-06-14, 78 y.o.   MRN: [id number]  HPI Here with concerns about hair loss. This had never been a problem for her until 8 months ago when she noticed her hair was thinning. She recently saw her dermatologist and he suggested she see us  for lab work. No recent medication changes. She has been fairly stressed after the death of her husband several months ago, but the hair loss began before this.    Review of Systems  Constitutional: Negative.   Respiratory: Negative.    Cardiovascular: Negative.        Objective:   Physical Exam Constitutional:      Appearance: Normal appearance.  Cardiovascular:     Rate and Rhythm: Normal rate and regular rhythm.     Pulses: Normal pulses.     Heart sounds: Normal heart sounds.  Pulmonary:     Effort: Pulmonary effort is normal.     Breath sounds: Normal breath sounds.  Skin:    Comments: Her hair is thin, the scalp appears normal   Neurological:     Mental Status: She is alert.           Assessment & Plan:  Hair loss. We will check labs today including a thyroid  panel. Garnette Olmsted, MD

## 2024-01-08 ENCOUNTER — Other Ambulatory Visit: Payer: Self-pay | Admitting: Family Medicine

## 2024-01-08 DIAGNOSIS — E118 Type 2 diabetes mellitus with unspecified complications: Secondary | ICD-10-CM

## 2024-01-09 ENCOUNTER — Ambulatory Visit: Payer: Self-pay | Admitting: Family Medicine

## 2024-01-10 ENCOUNTER — Other Ambulatory Visit: Payer: Self-pay

## 2024-01-10 MED ORDER — LEVOTHYROXINE SODIUM 100 MCG PO TABS: 100.0000 ug | ORAL_TABLET | Freq: Every day | ORAL | 3 refills | Status: AC

## 2024-02-11 ENCOUNTER — Ambulatory Visit: Admitting: Family Medicine

## 2024-02-11 ENCOUNTER — Encounter: Payer: Self-pay | Admitting: Family Medicine

## 2024-02-11 ENCOUNTER — Telehealth: Payer: Self-pay | Admitting: Family Medicine

## 2024-02-11 ENCOUNTER — Other Ambulatory Visit: Payer: Self-pay

## 2024-02-11 VITALS — BP 128/60 | HR 65 | Temp 97.8°F | Ht 65.0 in | Wt 146.0 lb

## 2024-02-11 DIAGNOSIS — E559 Vitamin D deficiency, unspecified: Secondary | ICD-10-CM

## 2024-02-11 DIAGNOSIS — I10 Essential (primary) hypertension: Secondary | ICD-10-CM | POA: Diagnosis not present

## 2024-02-11 DIAGNOSIS — E782 Mixed hyperlipidemia: Secondary | ICD-10-CM

## 2024-02-11 DIAGNOSIS — E039 Hypothyroidism, unspecified: Secondary | ICD-10-CM | POA: Diagnosis not present

## 2024-02-11 MED ORDER — VITAMIN D (ERGOCALCIFEROL) 1.25 MG (50000 UNIT) PO CAPS: 50000.0000 [IU] | ORAL_CAPSULE | ORAL | 3 refills | Status: AC

## 2024-02-11 MED ORDER — DICLOFENAC SODIUM 75 MG PO TBEC: 75.0000 mg | DELAYED_RELEASE_TABLET | Freq: Two times a day (BID) | ORAL | 3 refills | Status: AC

## 2024-02-11 MED ORDER — AMLODIPINE BESYLATE 5 MG PO TABS: 5.0000 mg | ORAL_TABLET | Freq: Two times a day (BID) | ORAL | 3 refills | Status: AC

## 2024-02-11 MED ORDER — ROSUVASTATIN CALCIUM 10 MG PO TABS: 10.0000 mg | ORAL_TABLET | Freq: Every day | ORAL | 3 refills | Status: AC

## 2024-02-11 NOTE — Telephone Encounter (Signed)
 Dr Johnny note states that pt will return in 2 months to follow up on HTN, and we will get labs to check a thyroid  panel and a vitamin D  level.

## 2024-02-11 NOTE — Telephone Encounter (Signed)
 Pt has cpe today and per pt suppose to come back in  3 wks for lab. Please put lab order in and I will call patient back to schedule

## 2024-02-11 NOTE — Progress Notes (Signed)
 Subjective:    Patient ID: Gabriella Farley, female    DOB: 1946/02/07, 78 y.o.   MRN: [id number]  HPI Here to follow up on issues. She feels well in general. She had labs drawn last month, and we increased the dose of her Levothyroxine . Her vitamin D  level was at the high end of normal. Her BP at home has been creeping up a little. She often gets systolic readings in the 150's.    Review of Systems  Constitutional: Negative.   HENT: Negative.    Eyes: Negative.   Respiratory: Negative.    Cardiovascular: Negative.   Gastrointestinal: Negative.   Genitourinary:  Negative for decreased urine volume, difficulty urinating, dyspareunia, dysuria, enuresis, flank pain, frequency, hematuria, pelvic pain and urgency.  Musculoskeletal: Negative.   Skin: Negative.   Neurological: Negative.  Negative for headaches.  Psychiatric/Behavioral: Negative.         Objective:   Physical Exam Constitutional:      General: She is not in acute distress.    Appearance: Normal appearance. She is well-developed.  HENT:     Head: Normocephalic and atraumatic.     Right Ear: External ear normal.     Left Ear: External ear normal.     Nose: Nose normal.     Mouth/Throat:     Pharynx: No oropharyngeal exudate.  Eyes:     General: No scleral icterus.    Conjunctiva/sclera: Conjunctivae normal.     Pupils: Pupils are equal, round, and reactive to light.  Neck:     Thyroid : No thyromegaly.     Vascular: No JVD.  Cardiovascular:     Rate and Rhythm: Normal rate and regular rhythm.     Pulses: Normal pulses.     Heart sounds: Normal heart sounds. No murmur heard.    No friction rub. No gallop.  Pulmonary:     Effort: Pulmonary effort is normal. No respiratory distress.     Breath sounds: Normal breath sounds. No wheezing or rales.  Chest:     Chest wall: No tenderness.  Abdominal:     General: Bowel sounds are normal. There is no distension.     Palpations: Abdomen is soft. There is no mass.      Tenderness: There is no abdominal tenderness. There is no guarding or rebound.  Musculoskeletal:        General: No tenderness. Normal range of motion.     Cervical back: Normal range of motion and neck supple.  Lymphadenopathy:     Cervical: No cervical adenopathy.  Skin:    General: Skin is warm and dry.     Findings: No erythema or rash.  Neurological:     General: No focal deficit present.     Mental Status: She is alert and oriented to person, place, and time.     Cranial Nerves: No cranial nerve deficit.     Motor: No abnormal muscle tone.     Coordination: Coordination normal.     Deep Tendon Reflexes: Reflexes are normal and symmetric. Reflexes normal.  Psychiatric:        Mood and Affect: Mood normal.        Behavior: Behavior normal.        Thought Content: Thought content normal.        Judgment: Judgment normal.           Assessment & Plan:  Her HTN is a bit under treated, so we will increase the Amlodipine  to 5  mg BID. We will reduce her dose of vitamin D  capsules to taking one every 14 days. We have adjusted her Levothyroxine  as above. Her hyperlipidemia is stable. She will return in 2 months to follow up on HTN, and we will get labs to check a thyroid  panel and a vitamin D  level. We spent a total of (33   ) minutes reviewing records and discussing these issues.  Garnette Olmsted, MD

## 2024-02-11 NOTE — Telephone Encounter (Signed)
 Pt is sch for 04-14-2024

## 2024-02-18 ENCOUNTER — Telehealth: Payer: Self-pay | Admitting: *Deleted

## 2024-02-18 DIAGNOSIS — E039 Hypothyroidism, unspecified: Secondary | ICD-10-CM

## 2024-02-18 DIAGNOSIS — E559 Vitamin D deficiency, unspecified: Secondary | ICD-10-CM

## 2024-02-18 NOTE — Telephone Encounter (Signed)
I put in the lab orders  

## 2024-02-18 NOTE — Telephone Encounter (Signed)
 Pt.notified

## 2024-02-18 NOTE — Telephone Encounter (Signed)
 Copied from CRM #[id number]. Topic: Clinical - Request for Lab/Test Order >> Feb 18, 2024  8:20 AM Kevelyn M wrote: Reason for CRM: Patient needs lab order request for Vitamin D  and thyroid . Patient switched office visit to a lab visit per Dr. Johnny.

## 2024-02-21 DIAGNOSIS — Z961 Presence of intraocular lens: Secondary | ICD-10-CM | POA: Diagnosis not present

## 2024-02-21 DIAGNOSIS — H52203 Unspecified astigmatism, bilateral: Secondary | ICD-10-CM | POA: Diagnosis not present

## 2024-02-21 DIAGNOSIS — H26493 Other secondary cataract, bilateral: Secondary | ICD-10-CM | POA: Diagnosis not present

## 2024-03-06 DIAGNOSIS — H26492 Other secondary cataract, left eye: Secondary | ICD-10-CM | POA: Diagnosis not present

## 2024-03-20 DIAGNOSIS — H26491 Other secondary cataract, right eye: Secondary | ICD-10-CM | POA: Diagnosis not present

## 2024-04-14 ENCOUNTER — Other Ambulatory Visit (INDEPENDENT_AMBULATORY_CARE_PROVIDER_SITE_OTHER)

## 2024-04-14 ENCOUNTER — Ambulatory Visit: Payer: Self-pay | Admitting: Family Medicine

## 2024-04-14 ENCOUNTER — Ambulatory Visit: Admitting: Family Medicine

## 2024-04-14 DIAGNOSIS — E559 Vitamin D deficiency, unspecified: Secondary | ICD-10-CM | POA: Diagnosis not present

## 2024-04-14 DIAGNOSIS — E039 Hypothyroidism, unspecified: Secondary | ICD-10-CM | POA: Diagnosis not present

## 2024-04-14 LAB — TSH: TSH: 0.15 u[IU]/mL — ABNORMAL LOW (ref 0.35–5.50)

## 2024-04-14 LAB — VITAMIN D 25 HYDROXY (VIT D DEFICIENCY, FRACTURES): VITD: 61.05 ng/mL (ref 30.00–100.00)

## 2024-04-14 LAB — T4, FREE: Free T4: 1.32 ng/dL (ref 0.60–1.60)

## 2024-04-14 LAB — T3, FREE: T3, Free: 3.2 pg/mL (ref 2.3–4.2)

## 2024-04-15 NOTE — Progress Notes (Addendum)
 TOSHA BELGARDE                                          MRN: [id number]   04/15/2024   The [facility] Quality Team Specialist reviewed this patient medical record for the purposes of chart review for care gap closure. The following were reviewed: chart review for care gap closure-kidney health evaluation for diabetes:eGFR  and uACR.  06/03/2024- cannot close KED for 2025   [facility] Quality Team

## 2024-05-23 ENCOUNTER — Other Ambulatory Visit: Payer: Self-pay

## 2024-05-23 ENCOUNTER — Telehealth: Payer: Self-pay

## 2024-05-23 DIAGNOSIS — E118 Type 2 diabetes mellitus with unspecified complications: Secondary | ICD-10-CM

## 2024-05-23 MED ORDER — ACCU-CHEK GUIDE W/DEVICE KIT: 1.0000 | PACK | Freq: Every day | 0 refills | Status: DC

## 2024-05-23 MED ORDER — ACCU-CHEK GUIDE W/DEVICE KIT: 1.0000 | PACK | Freq: Every day | 0 refills | Status: AC

## 2024-05-23 NOTE — Telephone Encounter (Signed)
 Copied from CRM [id number]. Topic: Clinical - Prescription Issue >> May 23, 2024 11:36 AM Robinson DEL wrote: Reason for CRM: Patient states she needs a new prescription to replace the [facility] VERIO device and test strips sent to [facility], states her insurance has changed and they no longer cover the [facility] VERIO. States they will cover Accucheck/Roche or the True/Trividia per letter she received.  [facility] [phone]

## 2024-05-23 NOTE — Telephone Encounter (Signed)
Rx sent to pt pharmacy as requested 

## 2024-06-02 ENCOUNTER — Other Ambulatory Visit: Payer: Self-pay | Admitting: Family Medicine
